# Patient Record
Sex: Female | Born: 1975 | Race: White | Hispanic: No | Marital: Married | Smoking: Never smoker
Health system: Southern US, Community
[De-identification: ages and names within clinical notes are randomized; demographics above are authoritative.]

## PROBLEM LIST (undated history)

## (undated) DIAGNOSIS — K644 Residual hemorrhoidal skin tags: Secondary | ICD-10-CM

## (undated) DIAGNOSIS — E538 Deficiency of other specified B group vitamins: Secondary | ICD-10-CM

## (undated) DIAGNOSIS — M51369 Other intervertebral disc degeneration, lumbar region without mention of lumbar back pain or lower extremity pain: Secondary | ICD-10-CM

## (undated) DIAGNOSIS — M5136 Other intervertebral disc degeneration, lumbar region: Secondary | ICD-10-CM

## (undated) HISTORY — DX: Other intervertebral disc degeneration, lumbar region: M51.36

## (undated) HISTORY — DX: Other intervertebral disc degeneration, lumbar region without mention of lumbar back pain or lower extremity pain: M51.369

## (undated) HISTORY — DX: Residual hemorrhoidal skin tags: K64.4

## (undated) HISTORY — DX: Deficiency of other specified B group vitamins: E53.8

---

## 2007-12-13 HISTORY — PX: TUBAL LIGATION: SHX77

## 2015-04-24 LAB — HM PAP SMEAR: HM PAP: NEGATIVE

## 2016-02-19 ENCOUNTER — Ambulatory Visit (INDEPENDENT_AMBULATORY_CARE_PROVIDER_SITE_OTHER): Payer: BC Managed Care – PPO | Admitting: Internal Medicine

## 2016-02-19 ENCOUNTER — Encounter: Payer: Self-pay | Admitting: Internal Medicine

## 2016-02-19 VITALS — BP 130/82 | HR 64 | Ht 62.0 in | Wt 187.0 lb

## 2016-02-19 DIAGNOSIS — K589 Irritable bowel syndrome without diarrhea: Secondary | ICD-10-CM | POA: Insufficient documentation

## 2016-02-19 DIAGNOSIS — R51 Headache: Secondary | ICD-10-CM | POA: Diagnosis not present

## 2016-02-19 DIAGNOSIS — E538 Deficiency of other specified B group vitamins: Secondary | ICD-10-CM | POA: Diagnosis not present

## 2016-02-19 DIAGNOSIS — R519 Headache, unspecified: Secondary | ICD-10-CM

## 2016-02-19 DIAGNOSIS — E559 Vitamin D deficiency, unspecified: Secondary | ICD-10-CM | POA: Diagnosis not present

## 2016-02-19 MED ORDER — CYANOCOBALAMIN 1000 MCG/ML IJ SOLN: 1000.0000 ug | INTRAMUSCULAR | Status: DC

## 2016-02-19 NOTE — Progress Notes (Signed)
Date:  02/19/2016   Name:  Melinda Nelson   DOB:  10-16-1976   MRN:  [id number] [facility]  Here to establish care.  Chief Complaint: New Evaluation; Headaches; and Vitamin B12 Deficiency B12 deficiciency - established about 3 years ago.  Initial level very low about 125.  Then got 4 weekly injections.  After that she started monthly injections.  Symptoms are fatigue and numbness. She had her follow-up B-12 level after the 4 injections which was in the 400 range. Since that time she's had no further levels drawn. There is no explanation for her deficiency; she's had no intestinal surgeries or gastric bypass.  Her main concern is that her B12 levels taper off before the end of the month and she has recurrent symptoms. She had requested more frequent dosing of her B12 but this was not permitted by her previous physician. She will her nurse friend administers the B12 at home.  Neuropathy - symptoms in left hand - numbness and tingling without pain in left arm and hand. Neurology feels that this is secondary to her B12 deficiency.  HPI Hormone related headaches - not migraines.  Evaluated by Neurology.  Worse just before menses.  She is not on any birth control.  She has tried Topamax but this worsened her neuropathy. Overall her headaches are slightly improved. She has not been back to the neurologist or tried any other empiric therapy.  MRI Brain was normal.   .Review of Systems  Constitutional: Negative for fever, chills and unexpected weight change.  Eyes: Negative for visual disturbance.  Respiratory: Negative for cough, chest tightness and shortness of breath.   Cardiovascular: Negative for chest pain, palpitations and leg swelling.  Gastrointestinal: Negative for abdominal pain.  Musculoskeletal: Negative for joint swelling and arthralgias.  Neurological: Positive for speech difficulty, numbness and headaches. Negative for dizziness, tremors, syncope and weakness.    Psychiatric/Behavioral: Negative for sleep disturbance and dysphoric mood.    There are no active problems to display for this patient.   Prior to Admission medications   Medication Sig Start Date End Date Taking? Authorizing Provider  cholecalciferol (VITAMIN D) 1000 units tablet Take 1,000 Units by mouth daily.   Yes Historical Provider, MD  cyanocobalamin (,VITAMIN B-12,) 1000 MCG/ML injection Inject 1 mL into the muscle every 30 (thirty) days. 12/01/15  Yes Historical Provider, MD  vitamin B-12 (CYANOCOBALAMIN) 1000 MCG tablet Take 1,000 mcg by mouth daily.   Yes Historical Provider, MD    Allergies  Allergen Reactions  . Lamisil [Terbinafine Hcl]     Skin reaction  . Penicillins     Rash  . Tramadol Nausea And Vomiting    Past Surgical History  Procedure Laterality Date  . Cesarean section  03,05,09    Social History  Substance Use Topics  . Smoking status: Never Smoker   . Smokeless tobacco: None  . Alcohol Use: 0.0 oz/week    0 Standard drinks or equivalent per week     Comment: rarely    Medication list has been reviewed and updated.   Physical Exam  Constitutional: She is oriented to person, place, and time. She appears well-developed. No distress.  HENT:  Head: Normocephalic and atraumatic.  Neck: Normal range of motion. Neck supple. No thyromegaly present.  Cardiovascular: Normal rate, regular rhythm and normal heart sounds.   Pulmonary/Chest: Effort normal and breath sounds normal. No respiratory distress.  Musculoskeletal: She exhibits no edema or tenderness.  Lymphadenopathy:  She has no cervical adenopathy.  Neurological: She is alert and oriented to person, place, and time. She has normal reflexes.  Skin: Skin is warm and dry. No rash noted.  Psychiatric: She has a normal mood and affect. Her behavior is normal. Thought content normal.  Nursing note and vitals reviewed.   BP 130/82 mmHg  Pulse 64  Ht  (1.575 m)  Wt 187 lb (84.823 kg)   BMI 34.19 kg/m2  LMP 01/29/2016 (Approximate)  Assessment and Plan: 1. Vitamin B12 deficiency We will check level and increase frequency to every 3 weeks After several doses will return for a trough B12 level and continue to adjust as needed - B12 - cyanocobalamin (,VITAMIN B-12,) 1000 MCG/ML injection; Inject 1 mL (1,000 mcg total) into the muscle every 14 (fourteen) days.  Dispense: 6 mL; Refill: 3  2. Vitamin D deficiency Continue daily oral supplementation Will notify patient if dose adjustment is required - VITAMIN D 25 Hydroxy (Vit-D Deficiency, Fractures)  3. Chronic daily headache stable and tolerable presently Consider referral back to neurology if worsening   Bari Edward, MD [facility]  02/19/2016

## 2016-02-20 LAB — VITAMIN D 25 HYDROXY (VIT D DEFICIENCY, FRACTURES): VIT D 25 HYDROXY: 28.2 ng/mL — AB (ref 30.0–100.0)

## 2016-02-20 LAB — VITAMIN B12: VITAMIN B 12: 388 pg/mL (ref 211–946)

## 2016-02-22 ENCOUNTER — Telehealth: Payer: Self-pay

## 2016-02-22 NOTE — Telephone Encounter (Signed)
-----   Message from Reubin MilanLaura H Berglund, MD sent at 02/20/2016  3:48 PM EST ----- B12 is low normal - take every 3 weeks and return in several months for recheck.  Vitamin D is still low - need to double supplement to 2000 IU daily.

## 2016-02-22 NOTE — Telephone Encounter (Signed)
Spoke with patient. Patient advised of all results and verbalized understanding. Will call back with any future questions or concerns. MAH  

## 2016-05-20 ENCOUNTER — Encounter: Payer: Self-pay | Admitting: Internal Medicine

## 2016-05-20 ENCOUNTER — Ambulatory Visit (INDEPENDENT_AMBULATORY_CARE_PROVIDER_SITE_OTHER): Payer: BC Managed Care – PPO | Admitting: Internal Medicine

## 2016-05-20 VITALS — BP 112/76 | HR 64 | Resp 14 | Ht 62.0 in | Wt 191.4 lb

## 2016-05-20 DIAGNOSIS — G629 Polyneuropathy, unspecified: Secondary | ICD-10-CM

## 2016-05-20 DIAGNOSIS — G4489 Other headache syndrome: Secondary | ICD-10-CM | POA: Insufficient documentation

## 2016-05-20 DIAGNOSIS — E559 Vitamin D deficiency, unspecified: Secondary | ICD-10-CM

## 2016-05-20 DIAGNOSIS — E538 Deficiency of other specified B group vitamins: Secondary | ICD-10-CM | POA: Diagnosis not present

## 2016-05-20 NOTE — Progress Notes (Signed)
Date:  05/20/2016   Name:  Coleen Cardiff   DOB:  25-Jun-1976   MRN:  [id number]   Chief Complaint: Follow-up Patient is here to follow up on B12 and Vitamin D.  She has not been taking Vitamin D regularly. She is using B12 inj every three weeks.  She feels that she may be less fatigued and her neuropathy is improving slowly.  Her only area of numbness is in her left arm and hand. Headaches are mild and unchanged.  She can usually sleep them off.  They are no longer debilitating as previously.  She has had a full neurological evaluation and MRI.   Review of Systems  Constitutional: Negative for fever, chills and unexpected weight change.  Eyes: Negative for visual disturbance.  Respiratory: Negative for cough, chest tightness and shortness of breath.   Cardiovascular: Negative for chest pain, palpitations and leg swelling.  Gastrointestinal: Negative for abdominal pain.  Musculoskeletal: Negative for joint swelling and arthralgias.  Neurological: Positive for numbness (improving - now just in left arm) and headaches (unchanged and tolerable now). Negative for dizziness, tremors, syncope, speech difficulty and weakness.  Psychiatric/Behavioral: Negative for sleep disturbance and dysphoric mood.    Patient Active Problem List   Diagnosis Date Noted  . Vitamin D deficiency 05/20/2016  . Vitamin B12 deficiency 05/20/2016  . 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 Neuropathy (HCC) 05/20/2016  . 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 Authorizing Provider  cholecalciferol (VITAMIN D) 1000 units tablet Take 1,000 Units by mouth daily.   Yes Historical Provider, MD  cyanocobalamin (,VITAMIN B-12,) 1000 MCG/ML injection Inject 1 mL (1,000 mcg total) into the muscle every 14 (fourteen) days. 02/19/16  Yes Reubin Milan, MD  vitamin B-12 (CYANOCOBALAMIN) 1000 MCG tablet Take 1,000 mcg by mouth daily. Reported on 05/20/2016     Historical Provider, MD    Allergies  Allergen Reactions  . Lamisil [Terbinafine Hcl]     Skin reaction  . Penicillins     Rash  . Tramadol Nausea And Vomiting    Past Surgical History  Procedure Laterality Date  . Cesarean section  03,05,09  . Tubal ligation  2009    Social History  Substance Use Topics  . Smoking status: Never Smoker   . Smokeless tobacco: None  . Alcohol Use: 0.0 oz/week    0 Standard drinks or equivalent per week     Comment: rarely    Medication list has been reviewed and updated.   Physical Exam  Constitutional: She is oriented to person, place, and time. She appears well-developed and well-nourished. No distress.  HENT:  Head: Normocephalic and atraumatic.  Neck: Normal range of motion. No thyromegaly present.  Cardiovascular: Normal rate, regular rhythm and normal heart sounds.   Pulmonary/Chest: Effort normal and breath sounds normal. No respiratory distress.  Neurological: She is alert and oriented to person, place, and time.  Skin: Skin is warm and dry. Rash (tick bite on left breast - benign appearance) noted.  Psychiatric: She has a normal mood and affect. Her behavior is normal. Thought content normal.  Nursing note and vitals reviewed.   BP 112/76 mmHg  Pulse 64  Resp 14  Ht  (1.575 m)  Wt 191 lb 6.4 oz (86.818 kg)  BMI 35.00 kg/m2  Assessment and Plan: 1. Vitamin D deficiency Pt encouraged to take supplement daily  2. Vitamin B12 deficiency Continue injection q  3 weeks - Vitamin B12  3. Other headache syndrome Stable; will follow up with neurology if needed  4. Neuropathy (HCC) Slowing improving   Bari EdwardLaura Daud Cayer, MD [facility]  05/20/2016

## 2016-05-21 LAB — VITAMIN B12: VITAMIN B 12: 518 pg/mL (ref 211–946)

## 2016-05-26 ENCOUNTER — Ambulatory Visit
Admission: EM | Admit: 2016-05-26 | Discharge: 2016-05-26 | Disposition: A | Discharge status: Home / Self Care | Payer: BC Managed Care – PPO | Attending: Family Medicine | Admitting: Family Medicine

## 2016-05-26 DIAGNOSIS — N390 Urinary tract infection, site not specified: Secondary | ICD-10-CM | POA: Diagnosis not present

## 2016-05-26 LAB — URINALYSIS COMPLETE WITH MICROSCOPIC (ARMC ONLY)
BILIRUBIN URINE: NEGATIVE
GLUCOSE, UA: 100 mg/dL — AB
Ketones, ur: NEGATIVE mg/dL
Nitrite: POSITIVE — AB
Protein, ur: 100 mg/dL — AB
Specific Gravity, Urine: 1.015 (ref 1.005–1.030)
pH: 7.5 (ref 5.0–8.0)

## 2016-05-26 MED ORDER — ONDANSETRON 8 MG PO TBDP
8.0000 mg | ORAL_TABLET | Freq: Once | ORAL | Status: AC
  Administered 2016-05-26: 8 mg via ORAL

## 2016-05-26 MED ORDER — PHENAZOPYRIDINE HCL 200 MG PO TABS: 200.0000 mg | ORAL_TABLET | Freq: Three times a day (TID) | ORAL | Status: DC

## 2016-05-26 MED ORDER — ONDANSETRON 8 MG PO TBDP: 8.0000 mg | ORAL_TABLET | Freq: Three times a day (TID) | ORAL | Status: DC | PRN

## 2016-05-26 MED ORDER — CIPROFLOXACIN HCL 500 MG PO TABS: 500.0000 mg | ORAL_TABLET | Freq: Two times a day (BID) | ORAL | Status: DC

## 2016-05-26 NOTE — ED Provider Notes (Signed)
CSN: [id number]     Arrival date & time 05/26/16  1742 History   First MD Initiated Contact with Patient 05/26/16 1900    Nurses notes were reviewed.  Chief Complaint  Patient presents with  . Urinary Tract Infection   Patient states was fine yesterday and then started having squishing pain this afternoon at work. She took some vitamin C tablets this a.m. which helped but at work this afternoon she started developing squishing pain discomfort nothing seemed to help. She's had UTIs before but nothing this bad and she states that the pain is not just in the urethral area but over the whole bladder area. She states that she doesn't usually get yeast infection she has a B12 deficiency and osteophytes the lumbar spine. Past surgery was C-section and tubal ligation. Mother and father both had hypertension. She's never smoked before. She is allergic to penicillin tramadol and Lamisil.  (Consider location/radiation/quality/duration/timing/severity/associated sxs/prior Treatment) Patient is a 40 y.o. female presenting with urinary tract infection. The history is provided by the patient and the spouse. No language interpreter was used.  Urinary Tract Infection Pain severity:  Moderate Onset quality:  Sudden Duration:  2 days Progression:  Worsening Chronicity:  New Recent urinary tract infections: yes   Relieved by:  Nothing Urinary symptoms: discolored urine and frequent urination   Associated symptoms: no fever   Risk factors: hx of pyelonephritis, kidney transplant, recurrent urinary tract infections and sexually transmitted infections     Past Medical History  Diagnosis Date  . B12 deficiency   . Degenerative disc disease, lumbar     L4-L5   Past Surgical History  Procedure Laterality Date  . Cesarean section  03,05,09  . Tubal ligation  2009   Family History  Problem Relation Age of Onset  . Hypertension Mother   . Hypertension Father   . Barrett's esophagus Father    Social  History  Substance Use Topics  . Smoking status: Never Smoker   . Smokeless tobacco: None  . Alcohol Use: 0.0 oz/week    0 Standard drinks or equivalent per week     Comment: rarely   OB History    No data available     Review of Systems  Constitutional: Negative for fever.  All other systems reviewed and are negative.   Allergies  Lamisil; Penicillins; and Tramadol  Home Medications   Prior to Admission medications   Medication Sig Start Date End Date Taking? Authorizing Provider  cholecalciferol (VITAMIN D) 1000 units tablet Take 1,000 Units by mouth daily.   Yes Historical Provider, MD  cyanocobalamin (,VITAMIN B-12,) 1000 MCG/ML injection Inject 1 mL (1,000 mcg total) into the muscle every 14 (fourteen) days. 02/19/16  Yes Reubin MilanLaura H Berglund, MD  vitamin B-12 (CYANOCOBALAMIN) 1000 MCG tablet Take 1,000 mcg by mouth daily. Reported on 05/20/2016   Yes Historical Provider, MD  ciprofloxacin (CIPRO) 500 MG tablet Take 1 tablet (500 mg total) by mouth 2 (two) times daily. 05/26/16   Hassan RowanEugene Ruthanna Macchia, MD  ondansetron (ZOFRAN ODT) 8 MG disintegrating tablet Take 1 tablet (8 mg total) by mouth every 8 (eight) hours as needed for nausea or vomiting. 05/26/16   Hassan RowanEugene Michaiah Maiden, MD  phenazopyridine (PYRIDIUM) 200 MG tablet Take 1 tablet (200 mg total) by mouth 3 (three) times daily. 05/26/16   Hassan RowanEugene Palak Tercero, MD   Meds Ordered and Administered this Visit   Medications  ondansetron (ZOFRAN-ODT) disintegrating tablet 8 mg (8 mg Oral Given 05/26/16 1908)    BP  135/87 mmHg  Pulse 84  Temp(Src) 98.6 F (37 C) (Oral)  Resp 16  Ht  (1.575 m)  Wt 190 lb (86.183 kg)  BMI 34.74 kg/m2  SpO2 98%  LMP 05/15/2016 No data found.   Physical Exam  Constitutional: She is oriented to person, place, and time. She appears well-developed and well-nourished.  HENT:  Head: Normocephalic and atraumatic.  Eyes: Pupils are equal, round, and reactive to light.  Neck: Neck supple.  Abdominal: Soft. Bowel sounds  are normal. She exhibits no distension. There is no hepatosplenomegaly. There is no tenderness. There is no CVA tenderness. No hernia. Hernia confirmed negative in the ventral area.  Musculoskeletal: Normal range of motion.  Neurological: She is alert and oriented to person, place, and time.  Skin: Skin is warm and dry.  Psychiatric: She has a normal mood and affect.  Vitals reviewed.   ED Course  Procedures (including critical care time)  Labs Review Labs Reviewed  URINALYSIS COMPLETEWITH MICROSCOPIC ([facility] ONLY) - Abnormal; Notable for the following:    Color, Urine ORANGE (*)    APPearance CLOUDY (*)    Glucose, UA 100 (*)    Hgb urine dipstick 3+ (*)    Protein, ur 100 (*)    Nitrite POSITIVE (*)    Leukocytes, UA 1+ (*)    Bacteria, UA FEW (*)    Squamous Epithelial / LPF 0-5 (*)    All other components within normal limits  URINE CULTURE    Imaging Review No results found.   Visual Acuity Review  Right Eye Distance:   Left Eye Distance:   Bilateral Distance:    Right Eye Near:   Left Eye Near:    Bilateral Near:     Results for orders placed or performed during the hospital encounter of 05/26/16  Urinalysis complete, with microscopic  Result Value Ref Range   Color, Urine ORANGE (A) YELLOW   APPearance CLOUDY (A) CLEAR   Glucose, UA 100 (A) NEGATIVE mg/dL   Bilirubin Urine NEGATIVE NEGATIVE   Ketones, ur NEGATIVE NEGATIVE mg/dL   Specific Gravity, Urine 1.015 1.005 - 1.030   Hgb urine dipstick 3+ (A) NEGATIVE   pH 7.5 5.0 - 8.0   Protein, ur 100 (A) NEGATIVE mg/dL   Nitrite POSITIVE (A) NEGATIVE   Leukocytes, UA 1+ (A) NEGATIVE   RBC / HPF 6-30 0 - 5 RBC/hpf   WBC, UA TOO NUMEROUS TO COUNT 0 - 5 WBC/hpf   Bacteria, UA FEW (A) NONE SEEN   Squamous Epithelial / LPF 0-5 (A) NONE SEEN   WBC Clumps PRESENT      MDM   1. UTI (lower urinary tract infection)     We'll place patient on Cipro 500 mg 1 tablet twice a day for a week. Pyridium 20 mg 3  times a day. Since she's had nausea with this Zofran 8 mg sublingual here and then again will give a prescription. Work note for today and tomorrow also be given to patient. Follow-up PCP in 2 weeks and urine culture was ordered.   Note: This dictation was prepared with Dragon dictation along with smaller phrase technology. Any transcriptional errors that result from this process are unintentional.     Hassan Rowan, MD 05/26/16 [phone]

## 2016-05-26 NOTE — ED Notes (Signed)
Patient complains of sharp stabbing pain and fullness of bladder. Patient states that she has been having cramping of her bladder. She states that she was having visible blood in her urine. Patient states that she took AZO around 2:15pm this afternoon.

## 2016-05-26 NOTE — Discharge Instructions (Signed)
Dysuria °Dysuria is pain or discomfort while urinating. The pain or discomfort may be felt in the tube that carries urine out of the bladder (urethra) or in the surrounding tissue of the genitals. The pain may also be felt in the groin area, lower abdomen, and lower back. You may have to urinate frequently or have the sudden feeling that you have to urinate (urgency). Dysuria can affect both men and women, but is more common in women. °Dysuria can be caused by many different things, including: °· Urinary tract infection in women. °· Infection of the kidney or bladder. °· Kidney stones or bladder stones. °· Certain sexually transmitted infections (STIs), such as chlamydia. °· Dehydration. °· Inflammation of the vagina. °· Use of certain medicines. °· Use of certain soaps or scented products that cause irritation. °HOME CARE INSTRUCTIONS °Watch your dysuria for any changes. The following actions may help to reduce any discomfort you are feeling: °· Drink enough fluid to keep your urine clear or pale yellow. °· Empty your bladder often. Avoid holding urine for long periods of time. °· After a bowel movement or urination, women should cleanse from front to back, using each tissue only once. °· Empty your bladder after sexual intercourse. °· Take medicines only as directed by your health care provider. °· If you were prescribed an antibiotic medicine, finish it all even if you start to feel better. °· Avoid caffeine, tea, and alcohol. They can irritate the bladder and make dysuria worse. In men, alcohol may irritate the prostate. °· Keep all follow-up visits as directed by your health care provider. This is important. °· If you had any tests done to find the cause of dysuria, it is your responsibility to obtain your test results. Ask the lab or department performing the test when and how you will get your results. Talk with your health care provider if you have any questions about your results. °SEEK MEDICAL CARE  IF: °· You develop pain in your back or sides. °· You have a fever. °· You have nausea or vomiting. °· You have blood in your urine. °· You are not urinating as often as you usually do. °SEEK IMMEDIATE MEDICAL CARE IF: °· You pain is severe and not relieved with medicines. °· You are unable to hold down any fluids. °· You or someone else notices a change in your mental function. °· You have a rapid heartbeat at rest. °· You have shaking or chills. °· You feel extremely weak. °  °This information is not intended to replace advice given to you by your health care provider. Make sure you discuss any questions you have with your health care provider. °  °Document Released: 08/26/2004 Document Revised: 12/19/2014 Document Reviewed: 07/24/2014 °Elsevier Interactive Patient Education ©2016 [employer] ° °Urinary Tract Infection °A urinary tract infection (UTI) can occur any place along the urinary tract. The tract includes the kidneys, ureters, bladder, and urethra. A type of germ called bacteria often causes a UTI. UTIs are often helped with antibiotic medicine.  °HOME CARE  °· If given, take antibiotics as told by your doctor. Finish them even if you start to feel better. °· Drink enough fluids to keep your pee (urine) clear or pale yellow. °· Avoid tea, drinks with caffeine, and bubbly (carbonated) drinks. °· Pee often. Avoid holding your pee in for a long time. °· Pee before and after having sex (intercourse). °· Wipe from front to back after you poop (bowel movement) if you are a   woman. Use each tissue only once. °GET HELP RIGHT AWAY IF:  °· You have back pain. °· You have lower belly (abdominal) pain. °· You have chills. °· You feel sick to your stomach (nauseous). °· You throw up (vomit). °· Your burning or discomfort with peeing does not go away. °· You have a fever. °· Your symptoms are not better in 3 days. °MAKE SURE YOU:  °· Understand these instructions. °· Will watch your condition. °· Will get help right  away if you are not doing well or get worse. °  °This information is not intended to replace advice given to you by your health care provider. Make sure you discuss any questions you have with your health care provider. °  °Document Released: 05/16/2008 Document Revised: 12/19/2014 Document Reviewed: 06/28/2012 °Elsevier Interactive Patient Education ©2016 [employer] ° °

## 2016-05-27 ENCOUNTER — Ambulatory Visit: Payer: BC Managed Care – PPO | Admitting: Internal Medicine

## 2016-05-28 LAB — URINE CULTURE: SPECIAL REQUESTS: NORMAL

## 2016-07-18 ENCOUNTER — Ambulatory Visit (INDEPENDENT_AMBULATORY_CARE_PROVIDER_SITE_OTHER): Payer: BC Managed Care – PPO | Admitting: Internal Medicine

## 2016-07-18 ENCOUNTER — Encounter: Payer: Self-pay | Admitting: Internal Medicine

## 2016-07-18 VITALS — BP 122/80 | HR 64 | Temp 100.1°F | Resp 16 | Ht 62.0 in | Wt 190.0 lb

## 2016-07-18 DIAGNOSIS — R509 Fever, unspecified: Secondary | ICD-10-CM | POA: Diagnosis not present

## 2016-07-18 MED ORDER — DOXYCYCLINE HYCLATE 100 MG PO TABS: 100.0000 mg | ORAL_TABLET | Freq: Two times a day (BID) | ORAL | 0 refills | Status: DC

## 2016-07-18 NOTE — Progress Notes (Signed)
Date:  07/18/2016   Name:  Melinda Nelson   DOB:  1976/10/25   MRN:  [id number]   Chief Complaint: Headache (1 week); Back Pain (1 week); and Fever (low grade) Headache   This is a new problem. The current episode started in the past 7 days. The problem occurs constantly. The problem has been gradually worsening. The pain is located in the occipital region. The pain does not radiate. The quality of the pain is described as boring. Associated symptoms include back pain, a fever and nausea. Pertinent negatives include no coughing, ear pain, sinus pressure or sore throat.  Back Pain  This is a new problem. The pain is present in the lumbar spine. The quality of the pain is described as shooting. Associated symptoms include a fever and headaches. Pertinent negatives include no chest pain.  Fever   The current episode started in the past 7 days. The maximum temperature noted was 101 to 101.9 F. Associated symptoms include headaches and nausea. Pertinent negatives include no chest pain, coughing, ear pain, rash or sore throat. She has tried NSAIDs for the symptoms. The treatment provided moderate relief.   She has no neck stiffness and fever responds to Advil.  She has had multiple tick bites this summer.  She denies rash.     Review of Systems  Constitutional: Positive for fever.  HENT: Negative for ear pain, sinus pressure and sore throat.   Respiratory: Negative for cough.   Cardiovascular: Negative for chest pain, palpitations and leg swelling.  Gastrointestinal: Positive for nausea.  Musculoskeletal: Positive for back pain.  Skin: Negative for rash.  Neurological: Positive for headaches.    Patient Active Problem List   Diagnosis Date Noted  . Vitamin D deficiency 05/20/2016  . Vitamin B12 deficiency 05/20/2016  . 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 Neuropathy (HCC) 05/20/2016  . 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 Authorizing Provider  cholecalciferol (VITAMIN D) 1000 units tablet Take 1,000 Units by mouth daily.   Yes Historical Provider, MD  cyanocobalamin (,VITAMIN B-12,) 1000 MCG/ML injection Inject 1 mL (1,000 mcg total) into the muscle every 14 (fourteen) days. 02/19/16  Yes Reubin MilanLaura H Caswell Alvillar, MD  phenazopyridine (PYRIDIUM) 200 MG tablet Take 1 tablet (200 mg total) by mouth 3 (three) times daily. 05/26/16  Yes Hassan RowanEugene Wade, MD  vitamin B-12 (CYANOCOBALAMIN) 1000 MCG tablet Take 1,000 mcg by mouth daily. Reported on 05/20/2016   Yes Historical Provider, MD    Allergies  Allergen Reactions  . Lamisil [Terbinafine Hcl]     Skin reaction  . Penicillins     Rash  . Tramadol Nausea And Vomiting    Past Surgical History:  Procedure Laterality Date  . CESAREAN SECTION  03,05,09  . TUBAL LIGATION  2009    Social History  Substance Use Topics  . Smoking status: Never Smoker  . Smokeless tobacco: Never Used  . Alcohol use 0.0 oz/week     Comment: rarely     Medication list has been reviewed and updated.   Physical Exam  Constitutional: She is oriented to person, place, and time. She appears well-developed. She has a sickly appearance. No distress.  HENT:  Head: Normocephalic and atraumatic.  Right Ear: Tympanic membrane and ear canal normal.  Left Ear: Tympanic membrane and ear canal normal.  Nose: Right sinus exhibits no maxillary sinus tenderness. Left sinus exhibits no maxillary sinus tenderness.  Mouth/Throat:  No posterior oropharyngeal edema or posterior oropharyngeal erythema.  Neck: Normal range of motion and full passive range of motion without pain. Neck supple. Carotid bruit is not present.  Cardiovascular: Normal rate, regular rhythm and normal heart sounds.   Pulmonary/Chest: Effort normal and breath sounds normal. No respiratory distress.  Musculoskeletal: Normal range of motion.  Neurological: She is alert and oriented to person, place, and time.  Skin: Skin is warm  and dry. Rash noted. Rash is macular (fine macular rash on forearms).  Psychiatric: She has a normal mood and affect. Her behavior is normal. Thought content normal.    BP 122/80 (BP Location: Right Arm, Patient Position: Sitting, Cuff Size: Normal)   Pulse 64   Temp 100.1 F (37.8 C) (Oral)   Resp 16   Ht  (1.575 m)   Wt 190 lb (86.2 kg)   SpO2 96%   BMI 34.75 kg/m   Assessment and Plan: 1. Other specified fever  treat for tick related illness Call if worsening - doxycycline (VIBRA-TABS) 100 MG tablet; Take 1 tablet (100 mg total) by mouth 2 (two) times daily.  Dispense: 20 tablet; Refill: 0   Bari Edward, MD [facility]  07/18/2016

## 2016-10-26 ENCOUNTER — Ambulatory Visit (INDEPENDENT_AMBULATORY_CARE_PROVIDER_SITE_OTHER): Payer: BC Managed Care – PPO | Admitting: Internal Medicine

## 2016-10-26 ENCOUNTER — Encounter: Payer: Self-pay | Admitting: Internal Medicine

## 2016-10-26 VITALS — BP 138/80 | HR 67 | Resp 16 | Ht 62.0 in | Wt 187.0 lb

## 2016-10-26 DIAGNOSIS — K644 Residual hemorrhoidal skin tags: Secondary | ICD-10-CM

## 2016-10-26 DIAGNOSIS — Z23 Encounter for immunization: Secondary | ICD-10-CM | POA: Diagnosis not present

## 2016-10-26 HISTORY — DX: Residual hemorrhoidal skin tags: K64.4

## 2016-10-26 MED ORDER — HYDROCORTISONE ACETATE 25 MG RE SUPP: 25.0000 mg | Freq: Two times a day (BID) | RECTAL | 5 refills | Status: DC

## 2016-10-26 NOTE — Progress Notes (Signed)
    Date:  10/26/2016   Name:  Melinda Nelson   DOB:  [birth year]   MRN:  [id number]   Chief Complaint: Hemorrhoids HPI Patient has hx of hemorrhoids that she treats as needed with anusol suppositories.  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  It is tender and swollen but not bleeding.  She ran out of suppositories yesterday.   Review of Systems  Constitutional: Negative for chills and fatigue.  Respiratory: Negative for chest tightness and shortness of breath.   Cardiovascular: Negative for chest pain.  Gastrointestinal: Positive for rectal pain. Negative for anal bleeding, constipation and nausea.  Genitourinary: Negative for dysuria.    Patient Active Problem List   Diagnosis Date Noted  . Vitamin D deficiency 05/20/2016  . Vitamin B12 deficiency 05/20/2016  . 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 Neuropathy (HCC) 05/20/2016  . 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 Authorizing Provider  cholecalciferol (VITAMIN D) 1000 units tablet Take 1,000 Units by mouth daily.   Yes Historical Provider, MD  cyanocobalamin (,VITAMIN B-12,) 1000 MCG/ML injection Inject 1 mL (1,000 mcg total) into the muscle every 14 (fourteen) days. 02/19/16  Yes Reubin MilanLaura H Berglund, MD    Allergies  Allergen Reactions  . Lamisil [Terbinafine Hcl]     Skin reaction  . Penicillins     Rash  . Tramadol Nausea And Vomiting    Past Surgical History:  Procedure Laterality Date  . CESAREAN SECTION  03,05,09  . TUBAL LIGATION  2009    Social History  Substance Use Topics  . Smoking status: Never Smoker  . Smokeless tobacco: Never Used  . Alcohol use 0.0 oz/week     Comment: rarely     Medication list has been reviewed and updated.   Physical Exam  Constitutional: She is oriented to person, place, and time. She appears well-developed. No distress.  HENT:  Head: Normocephalic and  atraumatic.  Pulmonary/Chest: Effort normal. No respiratory distress.  Abdominal: Soft. Normal appearance.  Genitourinary: Rectal exam shows external hemorrhoid (tender, not thrombosed or bleeding).  Musculoskeletal: Normal range of motion.  Neurological: She is alert and oriented to person, place, and time.  Skin: Skin is warm and dry. No rash noted.  Psychiatric: She has a normal mood and affect. Her behavior is normal. Thought content normal.  Nursing note and vitals reviewed.   BP 138/80   Pulse 67   Resp 16   Ht 5\' 2"  (1.575 m)   Wt 187 lb (84.8 kg)   LMP 10/19/2016 (Approximate)   SpO2 98%   BMI 34.20 kg/m   Assessment and Plan: 1. Inflamed external hemorrhoid - hydrocortisone (ANUSOL-HC) 25 MG suppository; Place 1 suppository (25 mg total) rectally 2 (two) times daily.  Dispense: 12 suppository; Refill: 5  2. Encounter for immunization - Flu Vaccine QUAD 36+ mos IM   Bari EdwardLaura Berglund, MD [facility]  10/26/2016

## 2016-12-09 LAB — HM MAMMOGRAPHY

## 2016-12-13 ENCOUNTER — Ambulatory Visit (INDEPENDENT_AMBULATORY_CARE_PROVIDER_SITE_OTHER): Payer: BC Managed Care – PPO | Admitting: Internal Medicine

## 2016-12-13 ENCOUNTER — Encounter: Payer: Self-pay | Admitting: Internal Medicine

## 2016-12-13 ENCOUNTER — Ambulatory Visit
Admission: RE | Admit: 2016-12-13 | Discharge: 2016-12-13 | Disposition: A | Discharge status: Home / Self Care | Payer: BC Managed Care – PPO | Source: Ambulatory Visit | Attending: Internal Medicine | Admitting: Internal Medicine

## 2016-12-13 VITALS — BP 142/82 | HR 76 | Temp 97.6°F | Ht 62.0 in | Wt 192.0 lb

## 2016-12-13 DIAGNOSIS — W19XXXA Unspecified fall, initial encounter: Secondary | ICD-10-CM | POA: Diagnosis not present

## 2016-12-13 DIAGNOSIS — E538 Deficiency of other specified B group vitamins: Secondary | ICD-10-CM

## 2016-12-13 DIAGNOSIS — S4991XA Unspecified injury of right shoulder and upper arm, initial encounter: Secondary | ICD-10-CM

## 2016-12-13 DIAGNOSIS — M25531 Pain in right wrist: Secondary | ICD-10-CM | POA: Insufficient documentation

## 2016-12-13 NOTE — Progress Notes (Signed)
Date:  12/13/2016   Name:  Colon FlatteryJill Shomaker   DOB:  1976-03-31   MRN:  [id number]   Chief Complaint: Arm Pain Arm Pain   The incident occurred more than 1 week ago. The incident occurred at home. The injury mechanism was a direct blow (thrown down from standing by a horse). The pain is present in the right forearm. The quality of the pain is described as aching. The pain radiates to the right arm. The pain is mild. The pain has been constant since the incident. Pertinent negatives include no chest pain, muscle weakness, numbness or tingling.   B12 - getting injections every 2 weeks.  Feels like the sx improve for about 4 days then worsen again.  She has neuropathic sx in both hands and also has numbness in both UE from shoulder to elbow when she lies on the corresponding side.   Review of Systems  Constitutional: Negative for chills, fatigue and fever.  Respiratory: Negative for chest tightness and shortness of breath.   Cardiovascular: Negative for chest pain.  Musculoskeletal: Positive for arthralgias.  Neurological: Negative for tingling and numbness.    Patient Active Problem List   Diagnosis Date Noted  . Inflamed external hemorrhoid 10/26/2016  . Vitamin D deficiency 05/20/2016  . Vitamin B12 deficiency 05/20/2016  . 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 Neuropathy (HCC) 05/20/2016  . 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 Authorizing Provider  cholecalciferol (VITAMIN D) 1000 units tablet Take 1,000 Units by mouth daily.   Yes Historical Provider, MD  cyanocobalamin (,VITAMIN B-12,) 1000 MCG/ML injection Inject 1 mL (1,000 mcg total) into the muscle every 14 (fourteen) days. 02/19/16  Yes Reubin MilanLaura H Sharise Lippy, MD  hydrocortisone (ANUSOL-HC) 25 MG suppository Place 1 suppository (25 mg total) rectally 2 (two) times daily. 10/26/16  Yes Reubin MilanLaura H Chamya Hunton, MD    Allergies  Allergen Reactions  . Lamisil [Terbinafine  Hcl]     Skin reaction  . Penicillins     Rash  . Tramadol Nausea And Vomiting    Past Surgical History:  Procedure Laterality Date  . CESAREAN SECTION  03,05,09  . TUBAL LIGATION  2009    Social History  Substance Use Topics  . Smoking status: Never Smoker  . Smokeless tobacco: Never Used  . Alcohol use 0.0 oz/week     Comment: rarely     Medication list has been reviewed and updated.   Physical Exam  Constitutional: She is oriented to person, place, and time. She appears well-developed. No distress.  HENT:  Head: Normocephalic and atraumatic.  Pulmonary/Chest: Effort normal. No respiratory distress.  Musculoskeletal: Normal range of motion.  Tender over distal radius on right; no deformity noted Mild soft tissue tenderness along outer forearm  Neurological: She is alert and oriented to person, place, and time.  Skin: Skin is warm and dry. No rash noted.  Psychiatric: She has a normal mood and affect. Her behavior is normal. Thought content normal.  Nursing note and vitals reviewed.   BP (!) 142/82   Pulse 76   Temp 97.6 F (36.4 C)   Ht 5\' 2"  (1.575 m)   Wt 192 lb (87.1 kg)   SpO2 98%   BMI 35.12 kg/m   Assessment and Plan: 1. Injury of right upper extremity, initial encounter Suspect occult fracture  Continue Advil every 6 hours; modify activities - DG Wrist Complete Right; Future  2. Vitamin  B12 deficiency Try 500 mg IM every week   Bari Edward, MD [facility]  12/13/2016

## 2017-04-01 ENCOUNTER — Other Ambulatory Visit: Payer: Self-pay | Admitting: Internal Medicine

## 2017-04-01 DIAGNOSIS — E538 Deficiency of other specified B group vitamins: Secondary | ICD-10-CM

## 2017-05-09 ENCOUNTER — Telehealth: Payer: Self-pay

## 2017-05-09 NOTE — Telephone Encounter (Signed)
Pt called stating back is spasming and having trouble moving. She states "steriods only thing that helped for this previously." I called and left pt VM explaining we have not seen her previously for this, and either way would still need to see her for OV before anything is prescribed. Told to call back and schedule office visit.

## 2017-05-19 ENCOUNTER — Ambulatory Visit: Payer: BC Managed Care – PPO | Admitting: Internal Medicine

## 2017-07-26 ENCOUNTER — Telehealth: Payer: Self-pay

## 2017-07-26 NOTE — Telephone Encounter (Signed)
Patient called stating [facility]. Then they recommended a follow up Ultrasound and Mammo for the LEFT breast. She had that done in January. They contacted her to get order from PCP for Diagnostic 6 month follow up of ultrasound and mammo of LEFT breast. Pt needs order. Please Advise.

## 2017-07-26 NOTE — Telephone Encounter (Signed)
Patient informed needs to [facility]. Dr Judithann GravesBerglund does not have report from follow up ultrasound or mammo. Told to call back if questions.

## 2017-07-26 NOTE — Telephone Encounter (Signed)
I can only see the [facility].  Additional views were recommended. I do not have that report of the study done at [facility].  If [facility], then [facility] needs to be the ones to make the referral again.

## 2017-11-26 ENCOUNTER — Ambulatory Visit (INDEPENDENT_AMBULATORY_CARE_PROVIDER_SITE_OTHER): Payer: BC Managed Care – PPO

## 2017-11-26 ENCOUNTER — Ambulatory Visit
Admission: EM | Admit: 2017-11-26 | Discharge: 2017-11-26 | Disposition: A | Discharge status: Home / Self Care | Payer: BC Managed Care – PPO | Attending: Family Medicine | Admitting: Family Medicine

## 2017-11-26 ENCOUNTER — Other Ambulatory Visit: Payer: Self-pay

## 2017-11-26 DIAGNOSIS — R05 Cough: Secondary | ICD-10-CM

## 2017-11-26 DIAGNOSIS — J9801 Acute bronchospasm: Secondary | ICD-10-CM | POA: Diagnosis not present

## 2017-11-26 DIAGNOSIS — R0602 Shortness of breath: Secondary | ICD-10-CM | POA: Diagnosis not present

## 2017-11-26 DIAGNOSIS — J101 Influenza due to other identified influenza virus with other respiratory manifestations: Secondary | ICD-10-CM

## 2017-11-26 MED ORDER — HYDROCOD POLST-CPM POLST ER 10-8 MG/5ML PO SUER: 5.0000 mL | Freq: Two times a day (BID) | ORAL | 0 refills | Status: DC | PRN

## 2017-11-26 MED ORDER — ALBUTEROL SULFATE HFA 108 (90 BASE) MCG/ACT IN AERS: 1.0000 | INHALATION_SPRAY | Freq: Four times a day (QID) | RESPIRATORY_TRACT | 0 refills | Status: DC | PRN

## 2017-11-26 MED ORDER — PREDNISONE 20 MG PO TABS: 20.0000 mg | ORAL_TABLET | Freq: Every day | ORAL | 0 refills | Status: DC

## 2017-11-26 MED ORDER — IPRATROPIUM-ALBUTEROL 0.5-2.5 (3) MG/3ML IN SOLN
3.0000 mL | Freq: Four times a day (QID) | RESPIRATORY_TRACT | Status: DC
  Administered 2017-11-26: 3 mL via RESPIRATORY_TRACT

## 2017-11-26 NOTE — ED Provider Notes (Signed)
[facility]    CSN: [id number] Arrival date & time: 11/26/17  1520     History   Chief Complaint Chief Complaint  Patient presents with  . Shortness of Breath    HPI Melinda Nelson is a 41 y.o. female.   41 yo female with a recent diagnosis of influenza B presents with a c/o worsening shortness of breath and wheezing over the last 2 days.    The history is provided by the patient.  Shortness of Breath    Past Medical History:  Diagnosis Date  . B12 deficiency   . Degenerative disc disease, lumbar    L4-L5    Patient Active Problem List   Diagnosis Date Noted  . Inflamed external hemorrhoid 10/26/2016  . Vitamin D deficiency 05/20/2016  . Vitamin B12 deficiency 05/20/2016  . 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 Neuropathy 05/20/2016  . 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 CESAREAN SECTION  03,05,09  . TUBAL LIGATION  2009    OB History    No data available       Home Medications    Prior to Admission medications   Medication Sig Start Date End Date Taking? Authorizing Provider  pseudoephedrine-guaifenesin (MUCINEX D) 60-600 MG 12 hr tablet Take 1 tablet by mouth every 12 (twelve) hours.   Yes [provider]  albuterol (PROVENTIL HFA;VENTOLIN HFA) 108 (90 Base) MCG/ACT inhaler Inhale 1-2 puffs into the lungs every 6 (six) hours as needed for wheezing or shortness of breath. 11/26/17   Payton Mccallumonty, Dennis Hegeman, MD  chlorpheniramine-HYDROcodone (TUSSIONEX PENNKINETIC ER) 10-8 MG/5ML SUER Take 5 mLs by mouth every 12 (twelve) hours as needed. 11/26/17   Payton Mccallumonty, Lisbet Busker, MD  cholecalciferol (VITAMIN D) 1000 units tablet Take 1,000 Units by mouth daily.    [provider]  cyanocobalamin (,VITAMIN B-12,) 1000 MCG/ML injection INJECT 1 ML (1,000 MCG TOTAL) INTO THE MUSCLE EVERY 14 (FOURTEEN) DAYS. 04/01/17   Reubin MilanBerglund, Laura H, MD  hydrocortisone (ANUSOL-HC) 25 MG suppository Place 1 suppository (25  mg total) rectally 2 (two) times daily. 10/26/16   Reubin MilanBerglund, Laura H, MD  predniSONE (DELTASONE) 20 MG tablet Take 1 tablet (20 mg total) by mouth daily. 11/26/17   Payton Mccallumonty, Pamela Intrieri, MD    Family History Family History  Problem Relation Age of Onset  . Hypertension Mother   . Hypertension Father   . Barrett's esophagus Father     Social History Social History   Tobacco Use  . Smoking status: Never Smoker  . Smokeless tobacco: Never Used  Substance Use Topics  . Alcohol use: Yes    Alcohol/week: 0.0 oz    Comment: rarely  . Drug use: No     Allergies   Lamisil [terbinafine]; Penicillins; and Tramadol   Review of Systems Review of Systems  Respiratory: Positive for shortness of breath.      Physical Exam Triage Vital Signs ED Triage Vitals  Enc Vitals Group     BP 11/26/17 1529 (!) 148/96     Pulse Rate 11/26/17 1529 99     Resp 11/26/17 1529 (!) 30     Temp 11/26/17 1529 99.6 F (37.6 C)     Temp Source 11/26/17 1529 Oral     SpO2 11/26/17 1529 98 %     Weight 11/26/17 1530 185 lb (83.9 kg)     Height 11/26/17 1530 5\' 2"  (1.575 m)     Head Circumference --  Peak Flow --      Pain Score 11/26/17 1531 3     Pain Loc --      Pain Edu? --      Excl. in GC? --    No data found.  Updated Vital Signs BP 127/83 (BP Location: Right Arm)   Pulse 83   Temp 98.8 F (37.1 C)   Resp (!) 26   Ht 5\' 2"  (1.575 m)   Wt 185 lb (83.9 kg)   LMP 11/19/2017   SpO2 98%   BMI 33.84 kg/m   Visual Acuity Right Eye Distance:   Left Eye Distance:   Bilateral Distance:    Right Eye Near:   Left Eye Near:    Bilateral Near:     Physical Exam  Constitutional: She appears well-developed and well-nourished. No distress.  Cardiovascular: Normal rate, regular rhythm and normal heart sounds.  No murmur heard. Pulmonary/Chest: Effort normal. No respiratory distress. She has wheezes.  Diffuse rhonchi and wheezes  Skin: She is not diaphoretic.  Nursing note and vitals  reviewed.    UC Treatments / Results  Labs (all labs ordered are listed, but only abnormal results are displayed) Labs Reviewed - No data to display  EKG  EKG Interpretation None       Radiology Dg Chest 2 View  Result Date: 11/26/2017 CLINICAL DATA:  Cough. EXAM: CHEST  2 VIEW COMPARISON:  None. FINDINGS: The heart size and mediastinal contours are within normal limits. Both lungs are clear. The visualized skeletal structures are unremarkable. IMPRESSION: No active cardiopulmonary disease. Electronically Signed   By: Gerome Samavid  Williams III M.D   On: 11/26/2017 16:02    Procedures Procedures (including critical care time)  Medications Ordered in UC Medications  ipratropium-albuterol (DUONEB) 0.5-2.5 (3) MG/3ML nebulizer solution 3 mL (3 mLs Nebulization Given 11/26/17 1539)     Initial Impression / Assessment and Plan / UC Course  I have reviewed the triage vital signs and the nursing notes.  Pertinent labs & imaging results that were available during my care of the patient were reviewed by me and considered in my medical decision making (see chart for details).       Final Clinical Impressions(s) / UC Diagnoses   Final diagnoses:  Bronchospasm, acute  Shortness of breath  Influenza B    ED Discharge Orders        Ordered    albuterol (PROVENTIL HFA;VENTOLIN HFA) 108 (90 Base) MCG/ACT inhaler  Every 6 hours PRN     11/26/17 1614    predniSONE (DELTASONE) 20 MG tablet  Daily     11/26/17 1614    chlorpheniramine-HYDROcodone (TUSSIONEX PENNKINETIC ER) 10-8 MG/5ML SUER  Every 12 hours PRN     11/26/17 1614     1. x-ray results and diagnosis reviewed with patient 2. Given duoneb x 1 with improvement of symptoms 3.  rx as per orders above; reviewed possible side effects, interactions, risks and benefits  4. Recommend supportive treatment with increased fluids  5. Follow-up prn if symptoms worsen or don't improve   Controlled Substance Prescriptions [place]  Controlled Substance Registry consulted? Not Applicable   Payton Mccallumonty, Arelis Neumeier, MD 11/26/17 [phone]

## 2017-11-26 NOTE — ED Triage Notes (Signed)
Pt was diagnosed with Influenza B on Thursday. Has had cough but her SOB is much worse today. Has been having "coughing fits" and wheezing. Was prescribed an inhaler but didn't get it filled. Pain 3/10

## 2018-02-17 IMAGING — CR DG CHEST 2V
2 series · 2 of 2 positions shown · non-contrast
Comparison: None.

CLINICAL DATA: Cough.

EXAM:
CHEST  2 VIEW

[chest pa]
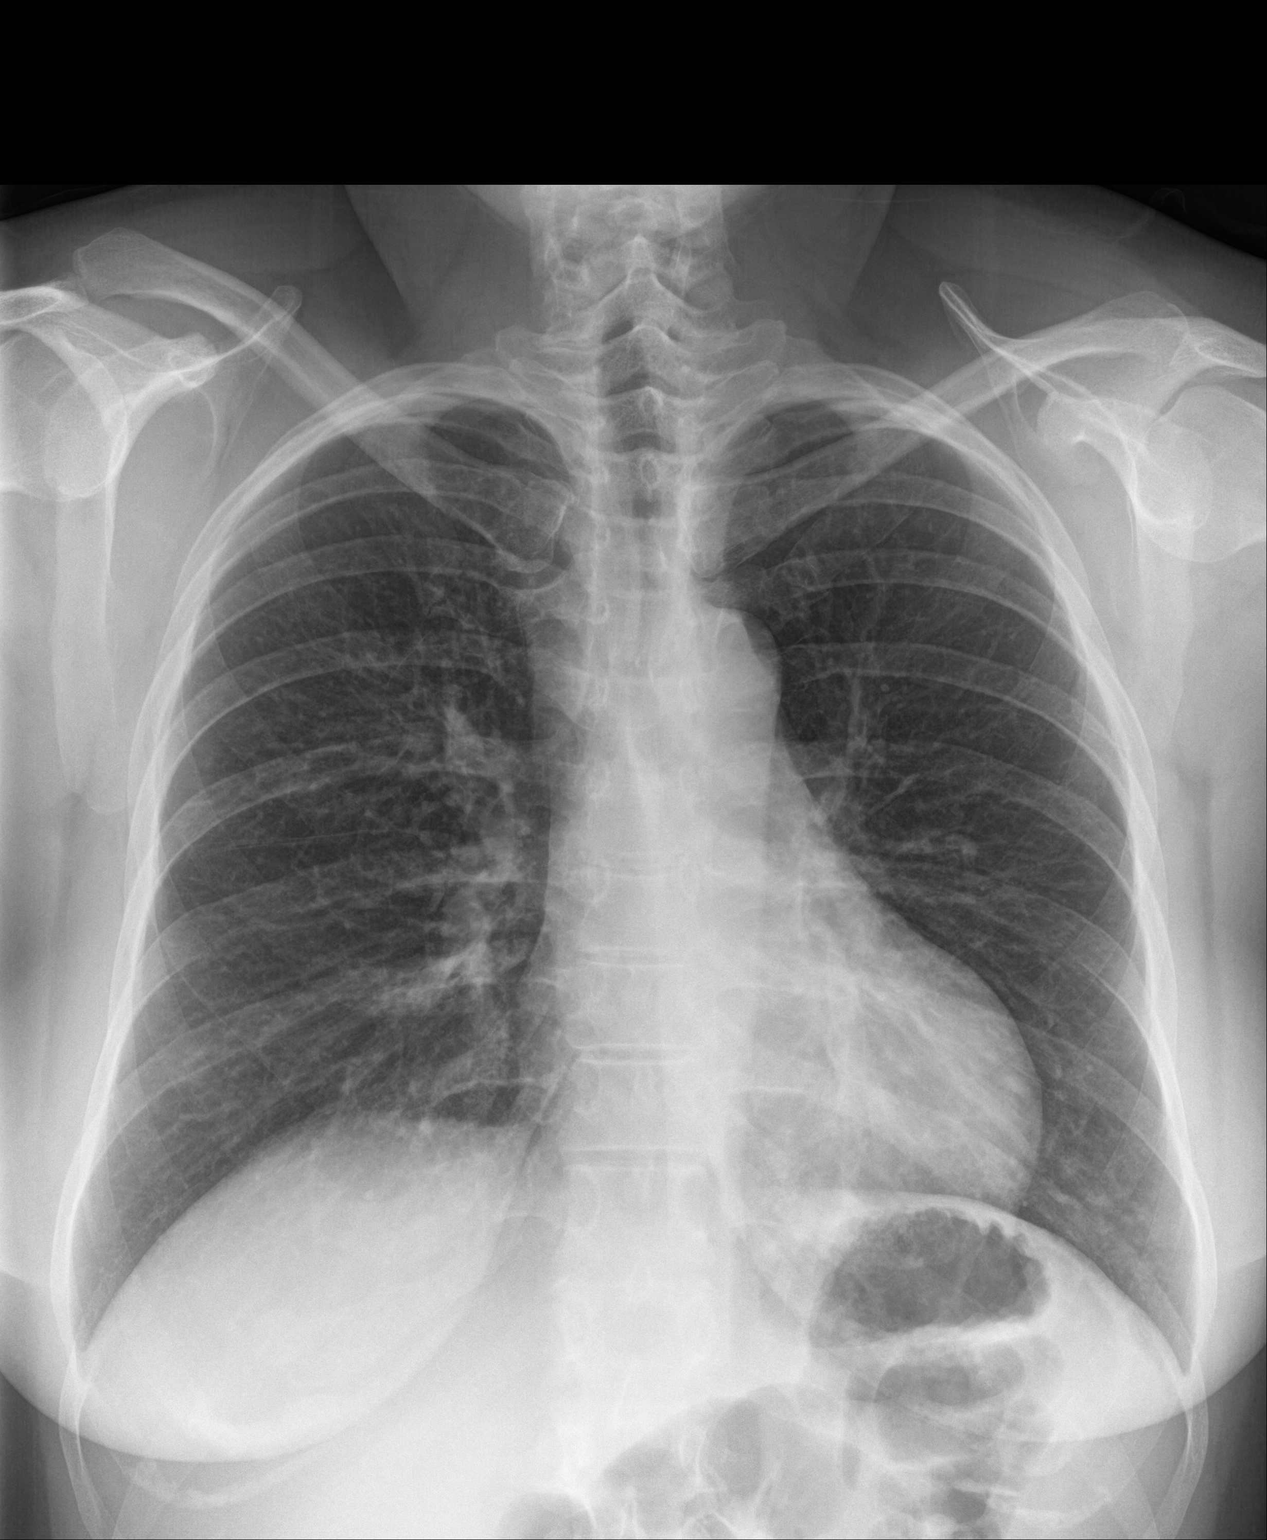

[chest lat]
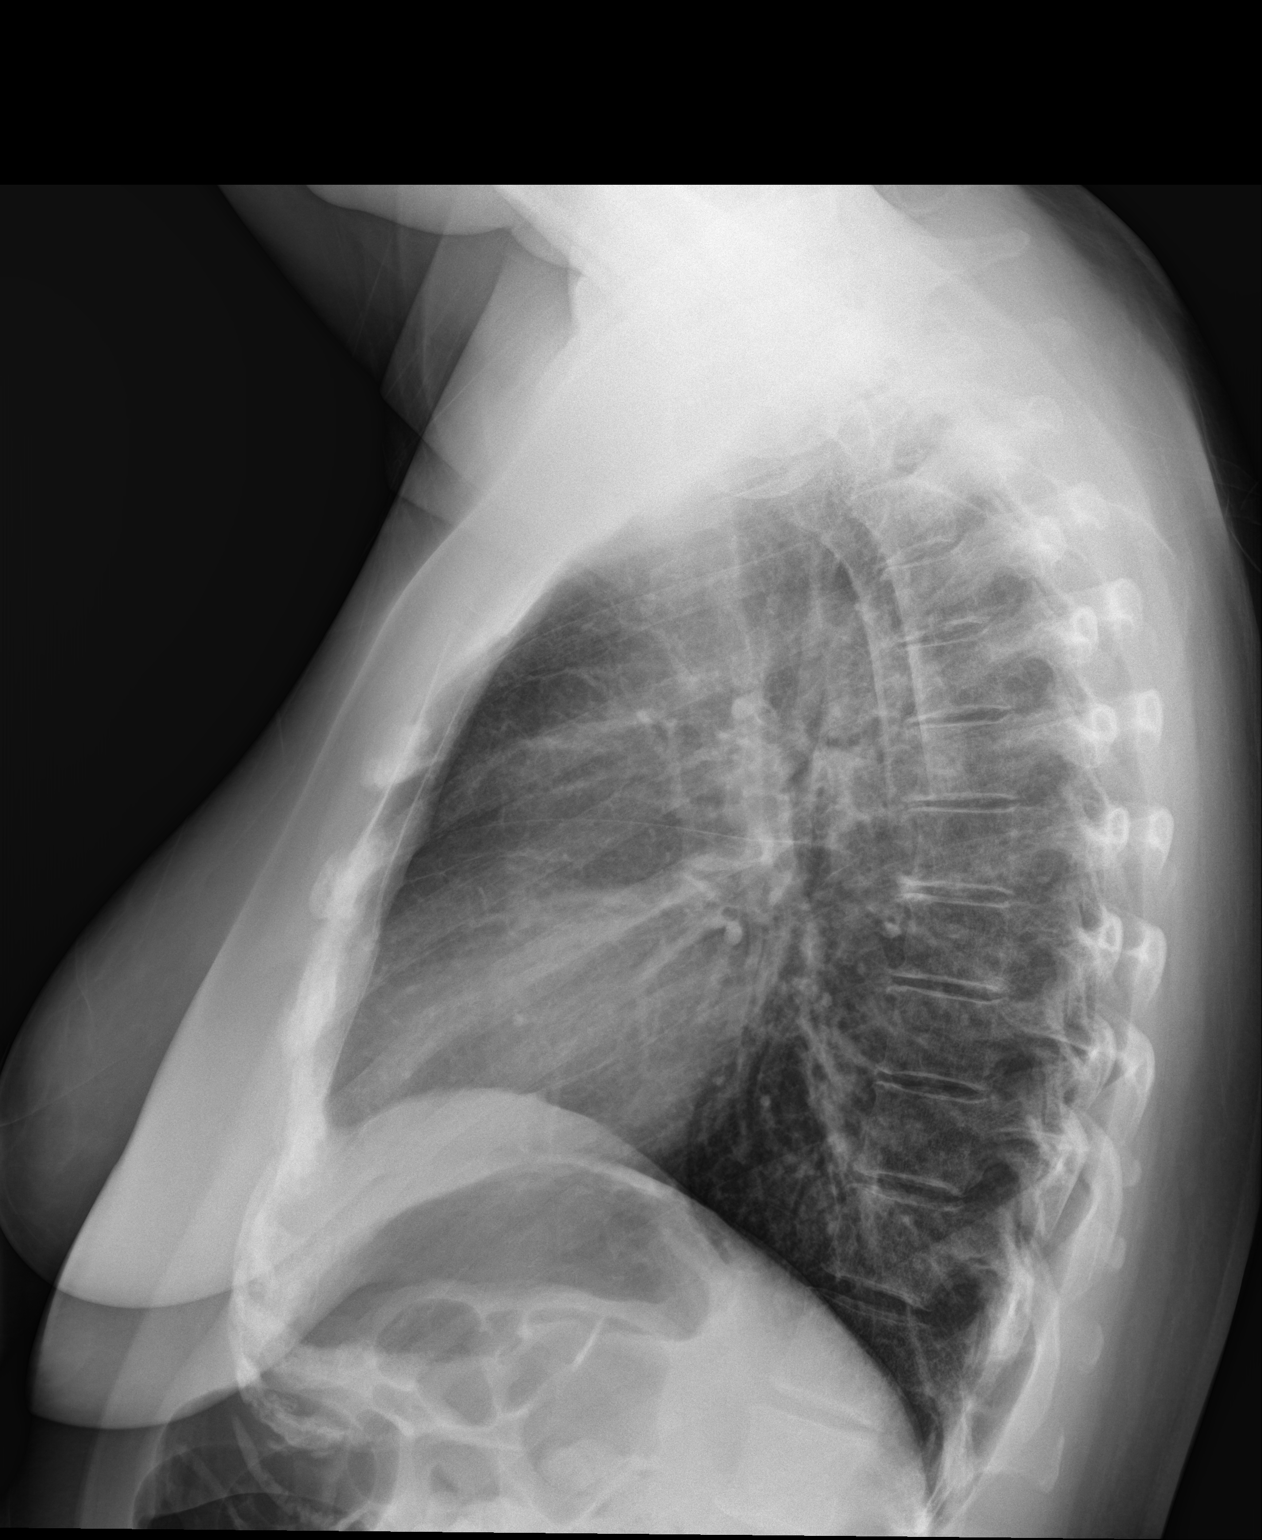

[2 of 2 positions shown; findings below may reference images not displayed]

FINDINGS: The heart size and mediastinal contours are within normal limits.
Both lungs are clear. The visualized skeletal structures are
unremarkable.
IMPRESSION: No active cardiopulmonary disease.

## 2018-08-24 ENCOUNTER — Other Ambulatory Visit: Payer: Self-pay | Admitting: Internal Medicine

## 2018-08-24 DIAGNOSIS — E538 Deficiency of other specified B group vitamins: Secondary | ICD-10-CM

## 2018-08-25 ENCOUNTER — Encounter: Payer: Self-pay | Admitting: Internal Medicine

## 2018-08-28 NOTE — Telephone Encounter (Signed)
Spoke with patient and she didn't realize it has been that long since she was seen here. Sent her to front desk to schedule next available CPE. She has not had labs, papsmear or mammos since she seen you. Does not have OBGYN.

## 2018-09-07 ENCOUNTER — Other Ambulatory Visit: Payer: Self-pay | Admitting: Internal Medicine

## 2018-09-07 ENCOUNTER — Ambulatory Visit: Payer: BC Managed Care – PPO | Admitting: Internal Medicine

## 2018-09-07 ENCOUNTER — Encounter: Payer: Self-pay | Admitting: Internal Medicine

## 2018-09-07 VITALS — BP 112/70 | HR 69 | Ht 62.0 in | Wt 190.0 lb

## 2018-09-07 DIAGNOSIS — N6002 Solitary cyst of left breast: Secondary | ICD-10-CM | POA: Diagnosis not present

## 2018-09-07 DIAGNOSIS — E538 Deficiency of other specified B group vitamins: Secondary | ICD-10-CM | POA: Diagnosis not present

## 2018-09-07 DIAGNOSIS — Z23 Encounter for immunization: Secondary | ICD-10-CM | POA: Diagnosis not present

## 2018-09-07 DIAGNOSIS — Z1239 Encounter for other screening for malignant neoplasm of breast: Secondary | ICD-10-CM

## 2018-09-07 DIAGNOSIS — Z1231 Encounter for screening mammogram for malignant neoplasm of breast: Secondary | ICD-10-CM | POA: Diagnosis not present

## 2018-09-07 MED ORDER — CYANOCOBALAMIN 1000 MCG/ML IJ SOLN: 1000.0000 ug | INTRAMUSCULAR | 1 refills | Status: DC

## 2018-09-07 NOTE — Progress Notes (Signed)
Date:  09/07/2018   Name:  Melinda Nelson   DOB:  31-Mar-1976   MRN:  [id number]   Chief Complaint: b12 def (b12 shot) and Immunizations (Flu shot.)  HPI B12 def - pt taking B12 injections at home.  Has been evaluated for neuropathy felt to be partly due to B12.  She also has fatigue and decreased concentration when she is due for an injection. She is feeling fairly well, menses are regular.  She does not have a GYN. She has not had a mammogram in the past year. [facility] was following a cyst on the left. She does not feel any mass or have any pain.  Review of Systems  Constitutional: Positive for fatigue (at times). Negative for fever and unexpected weight change.  HENT: Negative for tinnitus and trouble swallowing.   Respiratory: Negative for cough, chest tightness and shortness of breath.   Cardiovascular: Positive for palpitations (occasional). Negative for chest pain and leg swelling.  Gastrointestinal: Negative for abdominal pain.  Genitourinary: Negative for dysuria and hematuria.  Musculoskeletal: Negative for arthralgias.  Neurological: Negative for tremors, numbness and headaches.  Psychiatric/Behavioral: Negative for dysphoric mood.    Patient Active Problem List   Diagnosis Date Noted  . Breast cyst, left 09/07/2018  . Vitamin D deficiency 05/20/2016  . Vitamin B12 deficiency 05/20/2016  . 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 Neuropathy 05/20/2016  . IBS (irritable bowel syndrome) 02/19/2016    Allergies  Allergen Reactions  . Lamisil [Terbinafine]     Skin reaction  . Penicillins     Rash  . Tramadol Nausea And Vomiting    Past Surgical History:  Procedure Laterality Date  . CESAREAN SECTION     x 3  . TUBAL LIGATION  2009    Social History   Tobacco Use  . Smoking status: Never Smoker  . Smokeless tobacco: Never Used  Substance Use Topics  . Alcohol use: Yes    Alcohol/week: 0.0 standard drinks    Comment: rarely  . Drug use: No      Medication list has been reviewed and updated.  Current Meds  Medication Sig  . cholecalciferol (VITAMIN D) 1000 units tablet Take 1,000 Units by mouth daily.  . cyanocobalamin (,VITAMIN B-12,) 1000 MCG/ML injection Inject 1 mL (1,000 mcg total) into the muscle every 14 (fourteen) days.  . [DISCONTINUED] cyanocobalamin (,VITAMIN B-12,) 1000 MCG/ML injection INJECT 1 ML (1,000 MCG TOTAL) INTO THE MUSCLE EVERY 14 (FOURTEEN) DAYS.    PHQ 2/9 Scores 09/07/2018  PHQ - 2 Score 0    Physical Exam  Constitutional: She is oriented to person, place, and time. She appears well-developed. No distress.  HENT:  Head: Normocephalic and atraumatic.  Neck: Normal range of motion. Neck supple.  Cardiovascular: Normal rate, regular rhythm and normal heart sounds.  Pulmonary/Chest: Effort normal and breath sounds normal. No respiratory distress.  Musculoskeletal: She exhibits no edema.  Neurological: She is alert and oriented to person, place, and time.  Skin: Skin is warm and dry. No rash noted.  Psychiatric: She has a normal mood and affect. Her behavior is normal. Thought content normal.  Nursing note and vitals reviewed.   BP 112/70 (BP Location: Right Arm, Patient Position: Sitting, Cuff Size: Normal)   Pulse 69   Ht 5\' 2"  (1.575 m)   Wt 190 lb (86.2 kg)   SpO2 100%   BMI 34.75 kg/m   Assessment and Plan: 1. Vitamin B12 deficiency Continue monthly injections - cyanocobalamin (,  VITAMIN B-12,) 1000 MCG/ML injection; Inject 1 mL (1,000 mcg total) into the muscle every 14 (fourteen) days.  Dispense: 10 mL; Refill: 1  2. Breast cancer screening Schedule at [facility] - MM 3D SCREEN BREAST BILATERAL  3. Breast cyst, left  4. Need for influenza vaccination - Flu Vaccine QUAD 36+ mos IM   Partially dictated using Animal nutritionist. Any errors are unintentional.  Bari Edward, MD [facility]  09/07/2018

## 2018-10-22 ENCOUNTER — Ambulatory Visit
Admission: EM | Admit: 2018-10-22 | Discharge: 2018-10-22 | Disposition: A | Discharge status: Home / Self Care | Payer: BC Managed Care – PPO | Attending: Family Medicine | Admitting: Family Medicine

## 2018-10-22 ENCOUNTER — Other Ambulatory Visit: Payer: Self-pay

## 2018-10-22 ENCOUNTER — Encounter: Payer: Self-pay | Admitting: Emergency Medicine

## 2018-10-22 DIAGNOSIS — R319 Hematuria, unspecified: Secondary | ICD-10-CM

## 2018-10-22 DIAGNOSIS — N390 Urinary tract infection, site not specified: Secondary | ICD-10-CM

## 2018-10-22 DIAGNOSIS — S39012A Strain of muscle, fascia and tendon of lower back, initial encounter: Secondary | ICD-10-CM

## 2018-10-22 LAB — URINALYSIS, COMPLETE (UACMP) WITH MICROSCOPIC
RBC / HPF: >50 RBC/hpf (ref 0–5)
WBC, UA: >50 WBC/hpf (ref 0–5)

## 2018-10-22 MED ORDER — NITROFURANTOIN MONOHYD MACRO 100 MG PO CAPS: 100.0000 mg | ORAL_CAPSULE | Freq: Two times a day (BID) | ORAL | 0 refills | Status: DC

## 2018-10-22 MED ORDER — CYCLOBENZAPRINE HCL 10 MG PO TABS: 10.0000 mg | ORAL_TABLET | Freq: Every day | ORAL | 0 refills | Status: DC

## 2018-10-22 NOTE — ED Triage Notes (Addendum)
Patient c/o dysuria and urinary frequency that started this morning. Patient stated she took AZO at 11:45am this morning. Patient also c/o muscle spasm in her back that started yesterday.

## 2018-10-22 NOTE — ED Provider Notes (Signed)
[facility] ____________________________________________  Time seen: Approximately 4:16 PM  I have reviewed the triage vital signs and the nursing notes.   HISTORY  Chief Complaint Dysuria; Urinary Frequency; and Muscle Pain   HPI Melinda Nelson is a 42 y.o. female presenting for evaluation of urinary frequency, urinary urgency and some discomfort with urination present since last night.  Reports also having some pressure suprapubic consistent with previous UTIs.  Reports she has had recent diarrhea and recent sexual intercourse with her spouse, and states possible concerns for a UTI risk factors.  Continues to eat and drink well.  Denies vaginal discomfort or vaginal discharge.  Denies abdominal pain.  Does also reports some back pain, but reports she does not feel like this is affiliated with urinary symptoms as she states that she does have chronic degenerative disc disease with intermittent flareups.  Patient reports few days ago she was working at home as they are building a house, and states that she stepped on a wheel causing her to quickly twist her back and strain it.  States that she did not have any fall or direct hit trauma.  Continues to remain active and with full range of motion but pain and catching spasm with range of motion, particularly bending and standing.  Denies pain radiation, paresthesias, urinary or bowel retention or incontinence.  States this feels similar to flareup of back pain.  Denies known fevers.  States in the past steroids and muscle relaxants help with her back pain.  Denies other aggravating alleviating factors.  Reports otherwise feels well.   Reubin Milan, MD: PCP   Past Medical History:  Diagnosis Date  . B12 deficiency   . Degenerative disc disease, lumbar    L4-L5  . Inflamed external hemorrhoid 10/26/2016    Patient Active Problem List   Diagnosis Date Noted  . Breast cyst, left 09/07/2018  . Vitamin D deficiency 05/20/2016  .  Vitamin B12 deficiency 05/20/2016  . 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 Neuropathy 05/20/2016  . IBS (irritable bowel syndrome) 02/19/2016    Past Surgical History:  Procedure Laterality Date  . CESAREAN SECTION     x 3  . TUBAL LIGATION  2009     No current facility-administered medications for this encounter.   Current Outpatient Medications:  .  cholecalciferol (VITAMIN D) 1000 units tablet, Take 1,000 Units by mouth daily., Disp: , Rfl:  .  cyanocobalamin (,VITAMIN B-12,) 1000 MCG/ML injection, Inject 1 mL (1,000 mcg total) into the muscle every 14 (fourteen) days., Disp: 10 mL, Rfl: 1 .  cyclobenzaprine (FLEXERIL) 10 MG tablet, Take 1 tablet (10 mg total) by mouth at bedtime. Do not drive while taking as can cause drowsiness, Disp: 10 tablet, Rfl: 0 .  nitrofurantoin, macrocrystal-monohydrate, (MACROBID) 100 MG capsule, Take 1 capsule (100 mg total) by mouth 2 (two) times daily., Disp: 10 capsule, Rfl: 0  Allergies Lamisil [terbinafine]; Penicillins; and Tramadol  Family History  Problem Relation Age of Onset  . Hypertension Mother   . Hypertension Father   . Barrett's esophagus Father     Social History Social History   Tobacco Use  . Smoking status: Never Smoker  . Smokeless tobacco: Never Used  Substance Use Topics  . Alcohol use: Yes    Alcohol/week: 0.0 standard drinks    Comment: rarely  . Drug use: No    Review of Systems Constitutional: No fever. Cardiovascular: Denies chest pain. Respiratory: Denies shortness of breath. Gastrointestinal: No abdominal pain. No  nausea, no vomiting. No diarrhea.   Genitourinary: positive for dysuria. Musculoskeletal: positive for back pain. Skin: Negative for rash.   ____________________________________________   PHYSICAL EXAM:  VITAL SIGNS: ED Triage Vitals  Enc Vitals Group     BP 10/22/18 1437 (!) 144/100     Pulse Rate 10/22/18 1437 67     Resp 10/22/18 1437 18     Temp 10/22/18 1437 98.9 F (37.2  C)     Temp Source 10/22/18 1437 Oral     SpO2 10/22/18 1437 100 %     Weight 10/22/18 1436 190 lb (86.2 kg)     Height 10/22/18 1436 5\' 2"  (1.575 m)     Head Circumference --      Peak Flow --      Pain Score 10/22/18 1436 2     Pain Loc --      Pain Edu? --      Excl. in GC? --     Constitutional: Alert and oriented. Well appearing and in no acute distress. Eyes: Conjunctivae are normal.  ENT      Head: Normocephalic and atraumatic. Cardiovascular: Normal rate, regular rhythm. Grossly normal heart sounds.  Good peripheral circulation. Respiratory: Normal respiratory effort without tachypnea nor retractions. Breath sounds are clear and equal bilaterally. No wheezes, rales, rhonchi. Gastrointestinal: Minimal midline suprapubic tenderness.  Abdomen otherwise soft and nontender.  No CVA tenderness. Musculoskeletal:  No midline cervical, thoracic or lumbar tenderness to palpation. Bilateral pedal pulses equal and easily palpated.  Mild left lower left para lumbar tenderness to palpation, no point midline tenderness, pain increases with lumbar flexion and extension as well as mildly with right and left rotation but full range of motion present, no saddle anesthesia, no pain with standing bilateral knee lifts, steady gait.  Changes positions quickly in room. Neurologic:  Normal speech and language. No gross focal neurologic deficits are appreciated. Speech is normal. No gait instability.  Skin:  Skin is warm, dry and intact. No rash noted. Psychiatric: Mood and affect are normal. Speech and behavior are normal. Patient exhibits appropriate insight and judgment   ___________________________________________   LABS (all labs ordered are listed, but only abnormal results are displayed)  Labs Reviewed  URINALYSIS, COMPLETE (UACMP) WITH MICROSCOPIC - Abnormal; Notable for the following components:      Result Value   Color, Urine ORANGE (*)    APPearance CLOUDY (*)    Glucose, UA   (*)     Value: TEST NOT REPORTED DUE TO COLOR INTERFERENCE OF URINE PIGMENT   Hgb urine dipstick   (*)    Value: TEST NOT REPORTED DUE TO COLOR INTERFERENCE OF URINE PIGMENT   Bilirubin Urine   (*)    Value: TEST NOT REPORTED DUE TO COLOR INTERFERENCE OF URINE PIGMENT   Ketones, ur   (*)    Value: TEST NOT REPORTED DUE TO COLOR INTERFERENCE OF URINE PIGMENT   Protein, ur   (*)    Value: TEST NOT REPORTED DUE TO COLOR INTERFERENCE OF URINE PIGMENT   Nitrite   (*)    Value: TEST NOT REPORTED DUE TO COLOR INTERFERENCE OF URINE PIGMENT   Leukocytes, UA   (*)    Value: TEST NOT REPORTED DUE TO COLOR INTERFERENCE OF URINE PIGMENT   Bacteria, UA FEW (*)    All other components within normal limits  URINE CULTURE    PROCEDURES Procedures    INITIAL IMPRESSION / ASSESSMENT AND PLAN / ED COURSE  Pertinent labs &  imaging results that were available during my care of the patient were reviewed by me and considered in my medical decision making (see chart for details).  Well-appearing patient. No acute distress. Urinalysis reviewed, suspect UTI, will culture. Will treat with oral Macrobid. Suspect acute strain low back.  Discussed over-the-counter ibuprofen and Rx PRN Flexeril.  Encourage rest, fluids, supportive care and avoidance of triggers.  Follow-up with primary care as needed.Discussed indication, risks and benefits of medications with patient.   Discussed follow up and return parameters including no resolution or any worsening concerns. Patient verbalized understanding and agreed to plan.   ____________________________________________   FINAL CLINICAL IMPRESSION(S) / ED DIAGNOSES  Final diagnoses:  Urinary tract infection with hematuria, site unspecified  Strain of lumbar region, initial encounter     ED Discharge Orders         Ordered    nitrofurantoin, macrocrystal-monohydrate, (MACROBID) 100 MG capsule  2 times daily     10/22/18 1551    cyclobenzaprine (FLEXERIL) 10 MG tablet   Daily at bedtime     10/22/18 1551           Note: This dictation was prepared with Dragon dictation along with smaller phrase technology. Any transcriptional errors that result from this process are unintentional.         Renford Dills, NP 10/22/18 1723

## 2018-10-22 NOTE — Discharge Instructions (Addendum)
Take medication as prescribed. Rest. Drink plenty of fluids. Stretch. Avoid aggravating injury.   Follow up with your primary care physician this week as needed. Return to Urgent care for new or worsening concerns.

## 2018-10-24 LAB — URINE CULTURE

## 2018-12-26 ENCOUNTER — Encounter: Payer: Self-pay | Admitting: Internal Medicine

## 2018-12-26 ENCOUNTER — Other Ambulatory Visit: Payer: Self-pay | Admitting: Internal Medicine

## 2018-12-26 ENCOUNTER — Ambulatory Visit: Payer: BC Managed Care – PPO | Admitting: Internal Medicine

## 2018-12-26 VITALS — BP 138/78 | HR 90 | Temp 98.5°F | Ht 62.0 in | Wt 195.6 lb

## 2018-12-26 DIAGNOSIS — N3000 Acute cystitis without hematuria: Secondary | ICD-10-CM

## 2018-12-26 LAB — POC URINALYSIS WITH MICROSCOPIC (NON AUTO)MANUAL RESULT
Bilirubin, UA: NEGATIVE
Crystals: 0
Epithelial cells, urine per micros: 5
GLUCOSE UA: NEGATIVE
KETONES UA: NEGATIVE
Mucus, UA: 0
NITRITE UA: POSITIVE
Protein, UA: POSITIVE — AB
RBC: 30 M/uL — AB (ref 4.04–5.48)
SPEC GRAV UA: 1.02 (ref 1.010–1.025)
UROBILINOGEN UA: 0.2 U/dL
WBC Casts, UA: 50
pH, UA: 6 (ref 5.0–8.0)

## 2018-12-26 MED ORDER — SULFAMETHOXAZOLE-TRIMETHOPRIM 800-160 MG PO TABS: 1.0000 | ORAL_TABLET | Freq: Two times a day (BID) | ORAL | 0 refills | Status: AC

## 2018-12-26 NOTE — Progress Notes (Signed)
Date:  12/26/2018   Name:  Melinda Nelson   DOB:  Apr 19, 1976   MRN:  [id number]   Chief Complaint: Urinary Tract Infection (Pelvic pain and discomfort. Took Azo today at 1:30PM. Fighting them off for the past month. Drinking a lot of water and taking Vit C. This morning felt this UTI. )  Urinary Tract Infection   This is a recurrent problem. The current episode started more than 1 month ago. The problem occurs intermittently. The problem has been rapidly worsening (much worse yesterday and today). The pain is moderate. There has been no fever. Associated symptoms include chills, frequency, hematuria and urgency. Pertinent negatives include no flank pain or nausea. She has tried increased fluids and home medications for the symptoms. The treatment provided mild relief.    Review of Systems  Constitutional: Positive for chills, diaphoresis and fatigue. Negative for fever.  HENT: Positive for congestion and postnasal drip. Negative for sinus pressure and trouble swallowing.   Respiratory: Positive for cough. Negative for chest tightness, shortness of breath and wheezing.   Cardiovascular: Negative for chest pain and palpitations.  Gastrointestinal: Positive for abdominal distention. Negative for abdominal pain, constipation and nausea.  Genitourinary: Positive for frequency, hematuria and urgency. Negative for flank pain.  Neurological: Negative for dizziness and headaches.    Patient Active Problem List   Diagnosis Date Noted  . Breast cyst, left 09/07/2018  . Vitamin D deficiency 05/20/2016  . Vitamin B12 deficiency 05/20/2016  . 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 Neuropathy 05/20/2016  . IBS (irritable bowel syndrome) 02/19/2016    Allergies  Allergen Reactions  . Lamisil [Terbinafine]     Skin reaction  . Penicillins     Rash  . Tramadol Nausea And Vomiting    Past Surgical History:  Procedure Laterality Date  . CESAREAN SECTION     x 3  . TUBAL LIGATION  2009     Social History   Tobacco Use  . Smoking status: Never Smoker  . Smokeless tobacco: Never Used  Substance Use Topics  . Alcohol use: Yes    Alcohol/week: 0.0 standard drinks    Comment: rarely  . Drug use: No     Medication list has been reviewed and updated.  Current Meds  Medication Sig  . Ascorbic Acid (VITAMIN C) 1000 MG tablet Take 1,000 mg by mouth daily.  . Homeopathic Products (AZO CONFIDENCE PO) Take by mouth.  . Pseudoephedrine HCl (DECONGESTANT 60 PO) Take by mouth.    PHQ 2/9 Scores 09/07/2018  PHQ - 2 Score 0    Physical Exam Vitals signs and nursing note reviewed.  Constitutional:      Appearance: She is well-developed.  HENT:     Right Ear: Tympanic membrane is retracted. Tympanic membrane is not erythematous.     Left Ear: Tympanic membrane is retracted. Tympanic membrane is not erythematous.  Cardiovascular:     Rate and Rhythm: Normal rate and regular rhythm.     Heart sounds: Normal heart sounds.  Pulmonary:     Effort: Pulmonary effort is normal. No respiratory distress.     Breath sounds: Normal breath sounds.  Abdominal:     General: Bowel sounds are normal.     Palpations: Abdomen is soft.     Tenderness: There is abdominal tenderness in the suprapubic area. There is no guarding or rebound.     BP 138/78   Pulse 90   Temp 98.5 F (36.9 C) (Oral)   Ht 5'  2" (1.575 m)   Wt 195 lb 9.6 oz (88.7 kg)   LMP 11/25/2018 (Exact Date)   SpO2 100%   BMI 35.78 kg/m   Assessment and Plan: 1. Acute cystitis without hematuria Continue to increase fluids AZO x 3 days if needed for pain - sulfamethoxazole-trimethoprim (BACTRIM DS,SEPTRA DS) 800-160 MG tablet; Take 1 tablet by mouth 2 (two) times daily for 10 days.  Dispense: 20 tablet; Refill: 0 - Urine Culture - POC urinalysis w microscopic (non auto)   Partially dictated using Animal nutritionistDragon software. Any errors are unintentional.  Bari EdwardLaura Quamir Willemsen, MD [facility]  12/26/2018

## 2018-12-28 LAB — URINE CULTURE

## 2019-02-20 ENCOUNTER — Encounter: Payer: Self-pay | Admitting: Internal Medicine

## 2019-02-20 ENCOUNTER — Ambulatory Visit (INDEPENDENT_AMBULATORY_CARE_PROVIDER_SITE_OTHER): Payer: BC Managed Care – PPO | Admitting: Internal Medicine

## 2019-02-20 ENCOUNTER — Other Ambulatory Visit: Payer: Self-pay

## 2019-02-20 VITALS — BP 128/64 | HR 65 | Ht 62.0 in | Wt 197.0 lb

## 2019-02-20 DIAGNOSIS — K589 Irritable bowel syndrome without diarrhea: Secondary | ICD-10-CM | POA: Diagnosis not present

## 2019-02-20 DIAGNOSIS — Z0000 Encounter for general adult medical examination without abnormal findings: Secondary | ICD-10-CM | POA: Diagnosis not present

## 2019-02-20 DIAGNOSIS — E538 Deficiency of other specified B group vitamins: Secondary | ICD-10-CM

## 2019-02-20 DIAGNOSIS — Z23 Encounter for immunization: Secondary | ICD-10-CM | POA: Diagnosis not present

## 2019-02-20 LAB — POCT URINALYSIS DIPSTICK
BILIRUBIN UA: NEGATIVE
Blood, UA: NEGATIVE
GLUCOSE UA: NEGATIVE
KETONES UA: NEGATIVE
Leukocytes, UA: NEGATIVE
Nitrite, UA: NEGATIVE
PH UA: 7.5 (ref 5.0–8.0)
Protein, UA: NEGATIVE
Spec Grav, UA: 1.015 (ref 1.010–1.025)
UROBILINOGEN UA: 0.2 U/dL

## 2019-02-20 NOTE — Patient Instructions (Signed)
Tdap Vaccine (Tetanus, Diphtheria and Pertussis): What You Need to Know  1. Why get vaccinated?  Tetanus, diphtheria and pertussis are very serious diseases. Tdap vaccine can protect us from these diseases. And, Tdap vaccine given to pregnant women can protect newborn babies against pertussis..  TETANUS (Lockjaw) is rare in the [place] today. It causes painful muscle tightening and stiffness, usually all over the body.  · It can lead to tightening of muscles in the head and neck so you can't open your mouth, swallow, or sometimes even breathe. Tetanus kills about 1 out of 10 people who are infected even after receiving the best medical care.  DIPHTHERIA is also rare in the [place] today. It can cause a thick coating to form in the back of the throat.  · It can lead to breathing problems, heart failure, paralysis, and death.  PERTUSSIS (Whooping Cough) causes severe coughing spells, which can cause difficulty breathing, vomiting and disturbed sleep.  · It can also lead to weight loss, incontinence, and rib fractures. Up to 2 in 100 adolescents and 5 in 100 adults with pertussis are hospitalized or have complications, which could include pneumonia or death.  These diseases are caused by bacteria. Diphtheria and pertussis are spread from person to person through secretions from coughing or sneezing. Tetanus enters the body through cuts, scratches, or wounds.  Before vaccines, as many as 200,000 cases of diphtheria, 200,000 cases of pertussis, and hundreds of cases of tetanus, were reported in the [place] each year. Since vaccination began, reports of cases for tetanus and diphtheria have dropped by about 99% and for pertussis by about 80%.  2. Tdap vaccine  Tdap vaccine can protect adolescents and adults from tetanus, diphtheria, and pertussis. One dose of Tdap is routinely given at age 11 or 12. People who did not get Tdap at that age should get it as soon as possible.  Tdap is especially important  for healthcare professionals and anyone having close contact with a baby younger than 12 months.  Pregnant women should get a dose of Tdap during every pregnancy, to protect the newborn from pertussis. Infants are most at risk for severe, life-threatening complications from pertussis.  Another vaccine, called Td, protects against tetanus and diphtheria, but not pertussis. A Td booster should be given every 10 years. Tdap may be given as one of these boosters if you have never gotten Tdap before. Tdap may also be given after a severe cut or burn to prevent tetanus infection.  Your doctor or the person giving you the vaccine can give you more information.  Tdap may safely be given at the same time as other vaccines.  3. Some people should not get this vaccine  · A person who has ever had a life-threatening allergic reaction after a previous dose of any diphtheria, tetanus or pertussis containing vaccine, OR has a severe allergy to any part of this vaccine, should not get Tdap vaccine. Tell the person giving the vaccine about any severe allergies.  · Anyone who had coma or long repeated seizures within 7 days after a childhood dose of DTP or DTaP, or a previous dose of Tdap, should not get Tdap, unless a cause other than the vaccine was found. They can still get Td.  · Talk to your doctor if you:  ? have seizures or another nervous system problem,  ? had severe pain or swelling after any vaccine containing diphtheria, tetanus or pertussis,  ? ever had a condition   called Guillain-Barré Syndrome (GBS),  ? aren't feeling well on the day the shot is scheduled.  4. Risks  With any medicine, including vaccines, there is a chance of side effects. These are usually mild and go away on their own. Serious reactions are also possible but are rare.  Most people who get Tdap vaccine do not have any problems with it.  Mild problems following Tdap  (Did not interfere with activities)  · Pain where the shot was given (about 3 in 4  adolescents or 2 in 3 adults)  · Redness or swelling where the shot was given (about 1 person in 5)  · Mild fever of at least 100.4°F (up to about 1 in 25 adolescents or 1 in 100 adults)  · Headache (about 3 or 4 people in 10)  · Tiredness (about 1 person in 3 or 4)  · Nausea, vomiting, diarrhea, stomach ache (up to 1 in 4 adolescents or 1 in 10 adults)  · Chills, sore joints (about 1 person in 10)  · Body aches (about 1 person in 3 or 4)  · Rash, swollen glands (uncommon)  Moderate problems following Tdap  (Interfered with activities, but did not require medical attention)  · Pain where the shot was given (up to 1 in 5 or 6)  · Redness or swelling where the shot was given (up to about 1 in 16 adolescents or 1 in 12 adults)  · Fever over 102°F (about 1 in 100 adolescents or 1 in 250 adults)  · Headache (about 1 in 7 adolescents or 1 in 10 adults)  · Nausea, vomiting, diarrhea, stomach ache (up to 1 or 3 people in 100)  · Swelling of the entire arm where the shot was given (up to about 1 in 500).  Severe problems following Tdap  (Unable to perform usual activities; required medical attention)  · Swelling, severe pain, bleeding and redness in the arm where the shot was given (rare).  Problems that could happen after any vaccine:  · People sometimes faint after a medical procedure, including vaccination. Sitting or lying down for about 15 minutes can help prevent fainting, and injuries caused by a fall. Tell your doctor if you feel dizzy, or have vision changes or ringing in the ears.  · Some people get severe pain in the shoulder and have difficulty moving the arm where a shot was given. This happens very rarely.  · Any medication can cause a severe allergic reaction. Such reactions from a vaccine are very rare, estimated at fewer than 1 in a million doses, and would happen within a few minutes to a few hours after the vaccination.  As with any medicine, there is a very remote chance of a vaccine causing a serious  injury or death.  The safety of vaccines is always being monitored. For more information, visit: www.cdc.gov/vaccinesafety/  5. What if there is a serious problem?  What should I look for?  · Look for anything that concerns you, such as signs of a severe allergic reaction, very high fever, or unusual behavior.  Signs of a severe allergic reaction can include hives, swelling of the face and throat, difficulty breathing, a fast heartbeat, dizziness, and weakness. These would usually start a few minutes to a few hours after the vaccination.  What should I do?  · If you think it is a severe allergic reaction or other emergency that can't wait, call 9-1-1 or get the person to the nearest hospital. Otherwise,   call your doctor.  · Afterward, the reaction should be reported to the Vaccine Adverse Event Reporting System (VAERS). Your doctor might file this report, or you can do it yourself through the VAERS web site at www.vaers.hhs.gov, or by calling 1-800-822-7967.  VAERS does not give medical advice.  6. The National Vaccine Injury Compensation Program  The National Vaccine Injury Compensation Program (VICP) is a federal program that was created to compensate people who may have been injured by certain vaccines.  Persons who believe they may have been injured by a vaccine can learn about the program and about filing a claim by calling 1-800-338-2382 or visiting the VICP website at www.hrsa.gov/vaccinecompensation. There is a time limit to file a claim for compensation.  7. How can I learn more?  · Ask your doctor. He or she can give you the vaccine package insert or suggest other sources of information.  · Call your local or state health department.  · Contact the Centers for Disease Control and Prevention (CDC):  ? Call 1-800-232-4636 (1-800-CDC-INFO) or  ? Visit CDC's website at www.cdc.gov/vaccines  Vaccine Information Statement Tdap Vaccine (02/04/2014)  This information is not intended to replace advice given to you  by your health care provider. Make sure you discuss any questions you have with your health care provider.  Document Released: 05/29/2012 Document Revised: 07/16/2018 Document Reviewed: 07/16/2018  Elsevier Interactive Patient Education © 2019 [employer]

## 2019-02-20 NOTE — Progress Notes (Signed)
Date:  02/20/2019   Name:  Melinda Nelson   DOB:  1976/09/04   MRN:  [id number]   Chief Complaint: Annual Exam Melinda Nelson is a 43 y.o. female who presents today for her Complete Annual Exam. She feels well. 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 She reports she is sleeping well.  She is staying busy with children in sports, renovating her house and working.  She is overdue for mammogram.  She denies breast issues. Tdap is due. She continues on monthly B12 injections at home. She has stable IBS symptoms that have not changed.  No rectal pain, bleeding, abdominal pain or unexplained weight loss. HPI  Review of Systems  Constitutional: Negative for chills, fatigue, fever and unexpected weight change.  HENT: Negative for congestion, hearing loss, tinnitus, trouble swallowing and voice change.   Eyes: Negative for visual disturbance.  Respiratory: Negative for cough, chest tightness, shortness of breath and wheezing.   Cardiovascular: Negative for chest pain, palpitations and leg swelling.  Gastrointestinal: Positive for constipation and diarrhea. Negative for abdominal pain, blood in stool, rectal pain and vomiting.  Endocrine: Negative for polydipsia and polyuria.  Genitourinary: Negative for dysuria, frequency, genital sores, vaginal bleeding and vaginal discharge.  Musculoskeletal: Negative for arthralgias, gait problem and joint swelling.  Skin: Negative for color change and rash.  Allergic/Immunologic: Negative for environmental allergies.  Neurological: Negative for dizziness, tremors, light-headedness and headaches.  Hematological: Negative for adenopathy. Does not bruise/bleed easily.  Psychiatric/Behavioral: Negative for dysphoric mood and sleep disturbance. The patient is not nervous/anxious.     Patient Active Problem List   Diagnosis Date Noted   Breast cyst, left 09/07/2018   Vitamin D deficiency 05/20/2016   Vitamin B12 deficiency 05/20/2016   Other  headache syndrome 05/20/2016   Neuropathy 05/20/2016   IBS (irritable bowel syndrome) 02/19/2016    Allergies  Allergen Reactions   Lamisil [Terbinafine]     Skin reaction   Penicillins     Rash   Tramadol Nausea And Vomiting    Past Surgical History:  Procedure Laterality Date   CESAREAN SECTION     x 3   TUBAL LIGATION  2009    Social History   Tobacco Use   Smoking status: Never Smoker   Smokeless tobacco: Never Used  Substance Use Topics   Alcohol use: Yes    Alcohol/week: 0.0 standard drinks    Comment: rarely   Drug use: No     Medication list has been reviewed and updated.  Current Meds  Medication Sig   Ascorbic Acid (VITAMIN C) 1000 MG tablet Take 1,000 mg by mouth daily.   cholecalciferol (VITAMIN D) 1000 units tablet Take 1,000 Units by mouth daily.   cyanocobalamin (,VITAMIN B-12,) 1000 MCG/ML injection Inject 1 mL (1,000 mcg total) into the muscle every 14 (fourteen) days.   Homeopathic Products (AZO CONFIDENCE PO) Take by mouth.   Pseudoephedrine HCl (DECONGESTANT 60 PO) Take by mouth.    PHQ 2/9 Scores 02/20/2019 09/07/2018  PHQ - 2 Score 0 0    Physical Exam Vitals signs and nursing note reviewed.  Constitutional:      General: She is not in acute distress.    Appearance: She is well-developed.  HENT:     Head: Normocephalic and atraumatic.     Right Ear: Tympanic membrane and ear canal normal.     Left Ear: Tympanic membrane and ear canal normal.     Nose:     Right  Sinus: No maxillary sinus tenderness.     Left Sinus: No maxillary sinus tenderness.     Mouth/Throat:     Pharynx: Uvula midline.  Eyes:     General: No scleral icterus.       Right eye: No discharge.        Left eye: No discharge.     Conjunctiva/sclera: Conjunctivae normal.  Neck:     Musculoskeletal: Normal range of motion. No erythema.     Thyroid: No thyromegaly.     Vascular: No carotid bruit.  Cardiovascular:     Rate and Rhythm: Normal rate  and regular rhythm.     Pulses: Normal pulses.     Heart sounds: Normal heart sounds.  Pulmonary:     Effort: Pulmonary effort is normal. No respiratory distress.     Breath sounds: No wheezing.  Chest:     Breasts:        Right: No mass, nipple discharge, skin change or tenderness.        Left: No mass, nipple discharge, skin change or tenderness.  Abdominal:     General: Bowel sounds are normal.     Palpations: Abdomen is soft.     Tenderness: There is no abdominal tenderness.  Musculoskeletal: Normal range of motion.  Lymphadenopathy:     Cervical: No cervical adenopathy.  Skin:    General: Skin is warm and dry.     Findings: No rash.  Neurological:     Mental Status: She is alert and oriented to person, place, and time.     Cranial Nerves: No cranial nerve deficit.     Sensory: No sensory deficit.     Deep Tendon Reflexes: Reflexes are normal and symmetric.  Psychiatric:        Speech: Speech normal.        Behavior: Behavior normal.        Thought Content: Thought content normal.     Wt Readings from Last 3 Encounters:  02/20/19 197 lb (89.4 kg)  12/26/18 195 lb 9.6 oz (88.7 kg)  10/22/18 190 lb (86.2 kg)    BP 128/64    Pulse 65    Ht 5\' 2"  (1.575 m)    Wt 197 lb (89.4 kg)    SpO2 98%    BMI 36.03 kg/m   Assessment and Plan: 1. Annual physical exam Normal exam except for weight - continue healthy diet and active lifestyle - Lipid panel - POCT urinalysis dipstick  2. Vitamin B12 deficiency Continue monthly injection - Vitamin B12  3. Irritable bowel syndrome, unspecified type Stable with no red flag symptoms - Comprehensive metabolic panel - CBC with Differential/Platelet - TSH  4. Need for diphtheria-tetanus-pertussis (Tdap) vaccine - Tdap vaccine greater than or equal to 7yo IM   Partially dictated using Animal nutritionist. Any errors are unintentional.  Bari Edward, MD [facility]  02/20/2019

## 2019-02-21 LAB — COMPREHENSIVE METABOLIC PANEL
ALBUMIN: 4.2 g/dL (ref 3.8–4.8)
ALK PHOS: 80 IU/L (ref 39–117)
ALT: 14 IU/L (ref 0–32)
AST: 16 IU/L (ref 0–40)
Albumin/Globulin Ratio: 1.7 (ref 1.2–2.2)
BILIRUBIN TOTAL: 0.3 mg/dL (ref 0.0–1.2)
BUN / CREAT RATIO: 10 (ref 9–23)
BUN: 9 mg/dL (ref 6–24)
CHLORIDE: 103 mmol/L (ref 96–106)
CO2: 22 mmol/L (ref 20–29)
Calcium: 8.9 mg/dL (ref 8.7–10.2)
Creatinine, Ser: 0.93 mg/dL (ref 0.57–1.00)
GFR calc Af Amer: 86 mL/min/{1.73_m2} (ref >59)
GFR calc non Af Amer: 75 mL/min/{1.73_m2} (ref >59)
GLOBULIN, TOTAL: 2.5 g/dL (ref 1.5–4.5)
Glucose: 86 mg/dL (ref 65–99)
Potassium: 4.2 mmol/L (ref 3.5–5.2)
SODIUM: 140 mmol/L (ref 134–144)
Total Protein: 6.7 g/dL (ref 6.0–8.5)

## 2019-02-21 LAB — CBC WITH DIFFERENTIAL/PLATELET
BASOS ABS: 0 10*3/uL (ref 0.0–0.2)
Basos: 1 %
EOS (ABSOLUTE): 0.2 10*3/uL (ref 0.0–0.4)
Eos: 5 %
Hematocrit: 35.1 % (ref 34.0–46.6)
Hemoglobin: 11.3 g/dL (ref 11.1–15.9)
Immature Grans (Abs): 0 10*3/uL (ref 0.0–0.1)
Immature Granulocytes: 0 %
LYMPHS ABS: 1.9 10*3/uL (ref 0.7–3.1)
Lymphs: 47 %
MCH: 28.3 pg (ref 26.6–33.0)
MCHC: 32.2 g/dL (ref 31.5–35.7)
MCV: 88 fL (ref 79–97)
MONOS ABS: 0.3 10*3/uL (ref 0.1–0.9)
Monocytes: 8 %
Neutrophils Absolute: 1.6 10*3/uL (ref 1.4–7.0)
Neutrophils: 39 %
Platelets: 243 10*3/uL (ref 150–450)
RBC: 4 x10E6/uL (ref 3.77–5.28)
RDW: 14.1 % (ref 11.7–15.4)
WBC: 4.1 10*3/uL (ref 3.4–10.8)

## 2019-02-21 LAB — LIPID PANEL
CHOL/HDL RATIO: 4.1 ratio (ref 0.0–4.4)
CHOLESTEROL TOTAL: 148 mg/dL (ref 100–199)
HDL: 36 mg/dL — ABNORMAL LOW (ref >39)
LDL Calculated: 91 mg/dL (ref 0–99)
TRIGLYCERIDES: 106 mg/dL (ref 0–149)
VLDL Cholesterol Cal: 21 mg/dL (ref 5–40)

## 2019-02-21 LAB — TSH: TSH: 3.48 u[IU]/mL (ref 0.450–4.500)

## 2019-02-21 LAB — VITAMIN B12: Vitamin B-12: 480 pg/mL (ref 232–1245)

## 2020-02-25 ENCOUNTER — Other Ambulatory Visit: Payer: Self-pay

## 2020-02-25 ENCOUNTER — Ambulatory Visit (INDEPENDENT_AMBULATORY_CARE_PROVIDER_SITE_OTHER): Payer: BC Managed Care – PPO | Admitting: Internal Medicine

## 2020-02-25 ENCOUNTER — Encounter: Payer: Self-pay | Admitting: Internal Medicine

## 2020-02-25 VITALS — BP 126/74 | HR 69 | Temp 97.1°F | Ht 62.0 in | Wt 204.0 lb

## 2020-02-25 DIAGNOSIS — Z1231 Encounter for screening mammogram for malignant neoplasm of breast: Secondary | ICD-10-CM | POA: Diagnosis not present

## 2020-02-25 DIAGNOSIS — K582 Mixed irritable bowel syndrome: Secondary | ICD-10-CM

## 2020-02-25 DIAGNOSIS — E559 Vitamin D deficiency, unspecified: Secondary | ICD-10-CM | POA: Diagnosis not present

## 2020-02-25 DIAGNOSIS — Z0000 Encounter for general adult medical examination without abnormal findings: Secondary | ICD-10-CM

## 2020-02-25 DIAGNOSIS — E538 Deficiency of other specified B group vitamins: Secondary | ICD-10-CM

## 2020-02-25 LAB — POCT URINALYSIS DIPSTICK
Bilirubin, UA: NEGATIVE
Glucose, UA: NEGATIVE
Ketones, UA: NEGATIVE
Leukocytes, UA: NEGATIVE
Nitrite, UA: NEGATIVE
Protein, UA: NEGATIVE
Spec Grav, UA: 1.01 (ref 1.010–1.025)
Urobilinogen, UA: 0.2 E.U./dL
pH, UA: 6 (ref 5.0–8.0)

## 2020-02-25 MED ORDER — CYANOCOBALAMIN 1000 MCG/ML IJ SOLN: 1000.0000 ug | INTRAMUSCULAR | 1 refills | Status: DC

## 2020-02-25 NOTE — Progress Notes (Signed)
Date:  02/25/2020   Name:  Melinda Nelson   DOB:  1976-04-07   MRN:  [id number]   Chief Complaint: Annual Exam (Breast exam - Pt started period today and unable to do papsmear. Will reschedule pap only or wait until next year.) Melinda Nelson is a 44 y.o. female who presents today for her Complete Annual Exam. She feels well. 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 She reports she is sleeping well. She denies breast issues.  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  Mammogram 2019? [facility] Pap 2016 neg with cotesting; started menses today Immunization History  Administered Date(s) Administered  . Influenza,inj,Quad PF,6+ Mos 10/26/2016, 09/07/2018  . Influenza-Unspecified 10/16/2015  . Td 03/09/2008  . Tdap 02/20/2019    HPI  B12 def - still taking self administered B12 injection 1-2 times per month.  Intermittent tingling in fingers is unchanged.  No pain or weakness noted. IBS - intermittent diarrhea and constipation that is well managed with dietary changes.  No report of bleeding, pain, weight loss.  Lab Results  Component Value Date   CREATININE 0.93 02/20/2019   BUN 9 02/20/2019   NA 140 02/20/2019   K 4.2 02/20/2019   CL 103 02/20/2019   CO2 22 02/20/2019   Lab Results  Component Value Date   CHOL 148 02/20/2019   HDL 36 (L) 02/20/2019   LDLCALC 91 02/20/2019   TRIG 106 02/20/2019   CHOLHDL 4.1 02/20/2019   Lab Results  Component Value Date   TSH 3.480 02/20/2019   No results found for: HGBA1C   Review of Systems  Constitutional: Negative for chills, fatigue and fever.  HENT: Negative for congestion, hearing loss, tinnitus, trouble swallowing and voice change.   Eyes: Negative for visual disturbance (reading glasses).  Respiratory: Negative for cough, chest tightness, shortness of breath and wheezing.   Cardiovascular: Negative for chest pain, palpitations and leg swelling.  Gastrointestinal:  Positive for constipation and diarrhea. Negative for abdominal pain and vomiting.  Endocrine: Negative for polydipsia and polyuria.  Genitourinary: Negative for dysuria, frequency, genital sores, menstrual problem, vaginal bleeding and vaginal discharge.  Musculoskeletal: Negative for arthralgias, gait problem and joint swelling.  Skin: Negative for color change and rash.  Neurological: Positive for numbness (intermittent tingling in fingers thought to be due to low B12). Negative for dizziness, tremors, light-headedness and headaches.  Hematological: Negative for adenopathy. Does not bruise/bleed easily.  Psychiatric/Behavioral: Negative for dysphoric mood and sleep disturbance. The patient is not nervous/anxious.     Patient Active Problem List   Diagnosis Date Noted  . Breast cyst, left 09/07/2018  . Vitamin D deficiency 05/20/2016  . Vitamin B12 deficiency 05/20/2016  . 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 Neuropathy 05/20/2016  . IBS (irritable bowel syndrome) 02/19/2016    Allergies  Allergen Reactions  . Lamisil [Terbinafine]     Skin reaction  . Penicillins     Rash  . Tramadol Nausea And Vomiting    Past Surgical History:  Procedure Laterality Date  . CESAREAN SECTION     x 3  . TUBAL LIGATION  2009    Social History   Tobacco Use  . Smoking status: Never Smoker  . Smokeless tobacco: Never Used  Substance Use Topics  . Alcohol use: Yes    Alcohol/week: 0.0 standard drinks    Comment: rarely  . Drug use: No     Medication list has  been reviewed and updated.  Current Meds  Medication Sig  . Ascorbic Acid (VITAMIN C) 1000 MG tablet Take 1,000 mg by mouth daily.  . cholecalciferol (VITAMIN D) 1000 units tablet Take 1,000 Units by mouth daily.  . cyanocobalamin (,VITAMIN B-12,) 1000 MCG/ML injection Inject 1 mL (1,000 mcg total) into the muscle every 14 (fourteen) days.  . Pseudoephedrine HCl (DECONGESTANT 60 PO) Take by mouth.    PHQ 2/9 Scores  02/25/2020 02/20/2019 09/07/2018  PHQ - 2 Score 0 0 0  PHQ- 9 Score 0 - -    BP Readings from Last 3 Encounters:  02/25/20 126/74  02/20/19 128/64  12/26/18 138/78    Physical Exam Vitals and nursing note reviewed.  Constitutional:      General: She is not in acute distress.    Appearance: She is well-developed.  HENT:     Head: Normocephalic and atraumatic.     Right Ear: Tympanic membrane and ear canal normal.     Left Ear: Tympanic membrane and ear canal normal.     Nose:     Right Sinus: No maxillary sinus tenderness.     Left Sinus: No maxillary sinus tenderness.  Eyes:     General: No scleral icterus.       Right eye: No discharge.        Left eye: No discharge.     Conjunctiva/sclera: Conjunctivae normal.  Neck:     Thyroid: No thyromegaly.     Vascular: No carotid bruit.  Cardiovascular:     Rate and Rhythm: Normal rate and regular rhythm.     Pulses: Normal pulses.     Heart sounds: Normal heart sounds.  Pulmonary:     Effort: Pulmonary effort is normal. No respiratory distress.     Breath sounds: No wheezing.  Chest:     Breasts:        Right: No mass, nipple discharge, skin change or tenderness.        Left: No mass, nipple discharge, skin change or tenderness.  Abdominal:     General: Bowel sounds are normal.     Palpations: Abdomen is soft.     Tenderness: There is no abdominal tenderness.  Musculoskeletal:        General: Normal range of motion.     Cervical back: Normal range of motion. No erythema.  Lymphadenopathy:     Cervical: No cervical adenopathy.  Skin:    General: Skin is warm and dry.     Findings: No rash.  Neurological:     Mental Status: She is alert and oriented to person, place, and time.     Cranial Nerves: No cranial nerve deficit.     Sensory: No sensory deficit.     Deep Tendon Reflexes: Reflexes are normal and symmetric.  Psychiatric:        Speech: Speech normal.        Behavior: Behavior normal.        Thought Content:  Thought content normal.     Wt Readings from Last 3 Encounters:  02/25/20 204 lb (92.5 kg)  02/20/19 197 lb (89.4 kg)  12/26/18 195 lb 9.6 oz (88.7 kg)    BP 126/74   Pulse 69   Temp (!) 97.1 F (36.2 C) (Temporal)   Ht 5\' 2"  (1.575 m)   Wt 204 lb (92.5 kg)   LMP 02/25/2020 (Exact Date)   SpO2 100%   BMI 37.31 kg/m   Assessment and Plan: 1. Annual physical  exam Pap delayed to next year due to menses (no prior hx of abnormal Pap) Exam otherwise normal except for weight - Lipid panel - TSH - POCT urinalysis dipstick  2. Encounter for screening mammogram for breast cancer To be scheduled at [place]; Future  3. Irritable bowel syndrome with both constipation and diarrhea Stable intermittent symptoms controlled with diet - CBC with Differential/Platelet - Comprehensive metabolic panel  4. Vitamin D deficiency Continue daily supplement - VITAMIN D 25 Hydroxy (Vit-D Deficiency, Fractures)  5. Vitamin B12 deficiency Continue bi-monthly self injections - cyanocobalamin (,VITAMIN B-12,) 1000 MCG/ML injection; Inject 1 mL (1,000 mcg total) into the muscle every 14 (fourteen) days.  Dispense: 10 mL; Refill: 1   Partially dictated using Editor, commissioning. Any errors are unintentional.  Halina Maidens, MD [facility]  02/25/2020

## 2020-02-26 LAB — VITAMIN D 25 HYDROXY (VIT D DEFICIENCY, FRACTURES): Vit D, 25-Hydroxy: 24.2 ng/mL — ABNORMAL LOW (ref 30.0–100.0)

## 2020-02-26 LAB — LIPID PANEL
Chol/HDL Ratio: 5.2 ratio — ABNORMAL HIGH (ref 0.0–4.4)
Cholesterol, Total: 161 mg/dL (ref 100–199)
HDL: 31 mg/dL — ABNORMAL LOW (ref >39)
LDL Chol Calc (NIH): 106 mg/dL — ABNORMAL HIGH (ref 0–99)
Triglycerides: 133 mg/dL (ref 0–149)
VLDL Cholesterol Cal: 24 mg/dL (ref 5–40)

## 2020-02-26 LAB — CBC WITH DIFFERENTIAL/PLATELET
Basophils Absolute: 0 10*3/uL (ref 0.0–0.2)
Basos: 1 %
EOS (ABSOLUTE): 0.3 10*3/uL (ref 0.0–0.4)
Eos: 6 %
Hematocrit: 36.8 % (ref 34.0–46.6)
Hemoglobin: 12.1 g/dL (ref 11.1–15.9)
Immature Grans (Abs): 0 10*3/uL (ref 0.0–0.1)
Immature Granulocytes: 0 %
Lymphocytes Absolute: 1.9 10*3/uL (ref 0.7–3.1)
Lymphs: 41 %
MCH: 28.6 pg (ref 26.6–33.0)
MCHC: 32.9 g/dL (ref 31.5–35.7)
MCV: 87 fL (ref 79–97)
Monocytes Absolute: 0.4 10*3/uL (ref 0.1–0.9)
Monocytes: 9 %
Neutrophils Absolute: 2.1 10*3/uL (ref 1.4–7.0)
Neutrophils: 43 %
Platelets: 263 10*3/uL (ref 150–450)
RBC: 4.23 x10E6/uL (ref 3.77–5.28)
RDW: 13.5 % (ref 11.7–15.4)
WBC: 4.7 10*3/uL (ref 3.4–10.8)

## 2020-02-26 LAB — COMPREHENSIVE METABOLIC PANEL
ALT: 16 IU/L (ref 0–32)
AST: 17 IU/L (ref 0–40)
Albumin/Globulin Ratio: 1.5 (ref 1.2–2.2)
Albumin: 4.1 g/dL (ref 3.8–4.8)
Alkaline Phosphatase: 86 IU/L (ref 39–117)
BUN/Creatinine Ratio: 12 (ref 9–23)
BUN: 11 mg/dL (ref 6–24)
Bilirubin Total: 0.3 mg/dL (ref 0.0–1.2)
CO2: 23 mmol/L (ref 20–29)
Calcium: 9 mg/dL (ref 8.7–10.2)
Chloride: 104 mmol/L (ref 96–106)
Creatinine, Ser: 0.9 mg/dL (ref 0.57–1.00)
GFR calc Af Amer: 89 mL/min/{1.73_m2} (ref >59)
GFR calc non Af Amer: 77 mL/min/{1.73_m2} (ref >59)
Globulin, Total: 2.7 g/dL (ref 1.5–4.5)
Glucose: 95 mg/dL (ref 65–99)
Potassium: 4.5 mmol/L (ref 3.5–5.2)
Sodium: 139 mmol/L (ref 134–144)
Total Protein: 6.8 g/dL (ref 6.0–8.5)

## 2020-02-26 LAB — TSH: TSH: 7.72 u[IU]/mL — ABNORMAL HIGH (ref 0.450–4.500)

## 2020-02-27 LAB — T4: T4, Total: 6.1 ug/dL (ref 4.5–12.0)

## 2020-02-27 LAB — SPECIMEN STATUS REPORT

## 2020-05-26 ENCOUNTER — Ambulatory Visit: Payer: BC Managed Care – PPO | Admitting: Internal Medicine

## 2020-05-26 ENCOUNTER — Encounter: Payer: Self-pay | Admitting: Internal Medicine

## 2020-05-26 ENCOUNTER — Other Ambulatory Visit: Payer: Self-pay

## 2020-05-26 VITALS — BP 112/78 | HR 82 | Temp 98.2°F | Ht 62.0 in | Wt 201.0 lb

## 2020-05-26 DIAGNOSIS — M62838 Other muscle spasm: Secondary | ICD-10-CM

## 2020-05-26 DIAGNOSIS — R350 Frequency of micturition: Secondary | ICD-10-CM | POA: Diagnosis not present

## 2020-05-26 LAB — POCT UA - MICROSCOPIC ONLY
Casts, Ur, LPF, POC: 0
Crystals, Ur, HPF, POC: 0
Epithelial cells, urine per micros: 2
Mucus, UA: 0
RBC, urine, microscopic: 2
WBC, Ur, HPF, POC: 10
Yeast, UA: 0

## 2020-05-26 MED ORDER — FLUCONAZOLE 100 MG PO TABS
100.0000 mg | ORAL_TABLET | Freq: Every day | ORAL | 0 refills | Status: AC
Start: 2020-05-26 — End: 2020-05-29

## 2020-05-26 MED ORDER — CYCLOBENZAPRINE HCL 10 MG PO TABS: 10.0000 mg | ORAL_TABLET | Freq: Three times a day (TID) | ORAL | 0 refills | Status: DC | PRN

## 2020-05-26 MED ORDER — NITROFURANTOIN MONOHYD MACRO 100 MG PO CAPS: 100.0000 mg | ORAL_CAPSULE | Freq: Two times a day (BID) | ORAL | 0 refills | Status: AC

## 2020-05-26 NOTE — Progress Notes (Signed)
Date:  05/26/2020   Name:  Melinda Nelson   DOB:  04-24-1976   MRN:  [id number]   Chief Complaint: Urinary Tract Infection (Taking AZO. Having frequency, pain, bladder fullness. Started yesterday right before lunch. ) and Neck Pain (Stiff neck since yesterday- muscle spasms.)  Urinary Tract Infection  This is a new problem. The current episode started yesterday. The problem has been unchanged. The quality of the pain is described as aching and burning. The pain is moderate. There has been no fever. Associated symptoms include frequency, nausea and urgency. Pertinent negatives include no chills, discharge, flank pain or hematuria. She has tried increased fluids (AZO) for the symptoms. The treatment provided mild relief.  Neck Pain  This is a new problem. The current episode started yesterday. The problem has been unchanged. The pain is associated with nothing. The pain is present in the midline. The quality of the pain is described as aching. The pain is moderate. The symptoms are aggravated by twisting. Pertinent negatives include no chest pain, fever or headaches. She has tried heat and home exercises for the symptoms. The treatment provided mild relief.    Lab Results  Component Value Date   CREATININE 0.90 02/25/2020   BUN 11 02/25/2020   NA 139 02/25/2020   K 4.5 02/25/2020   CL 104 02/25/2020   CO2 23 02/25/2020   Lab Results  Component Value Date   CHOL 161 02/25/2020   HDL 31 (L) 02/25/2020   LDLCALC 106 (H) 02/25/2020   TRIG 133 02/25/2020   CHOLHDL 5.2 (H) 02/25/2020   Lab Results  Component Value Date   TSH 7.720 (H) 02/25/2020   No results found for: HGBA1C Lab Results  Component Value Date   WBC 4.7 02/25/2020   HGB 12.1 02/25/2020   HCT 36.8 02/25/2020   MCV 87 02/25/2020   PLT 263 02/25/2020   Lab Results  Component Value Date   ALT 16 02/25/2020   AST 17 02/25/2020   ALKPHOS 86 02/25/2020   BILITOT 0.3 02/25/2020     Review of Systems    Constitutional: Negative for chills, fatigue and fever.  Respiratory: Negative for chest tightness and shortness of breath.   Cardiovascular: Negative for chest pain.  Gastrointestinal: Positive for abdominal pain and nausea. Negative for constipation.  Genitourinary: Positive for frequency and urgency. Negative for flank pain and hematuria.  Musculoskeletal: Positive for myalgias and neck pain.  Neurological: Negative for dizziness and headaches.    Patient Active Problem List   Diagnosis Date Noted  . Breast cyst, left 09/07/2018  . Vitamin D deficiency 05/20/2016  . Vitamin B12 deficiency 05/20/2016  . 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 Neuropathy 05/20/2016  . IBS (irritable bowel syndrome) 02/19/2016    Allergies  Allergen Reactions  . Lamisil [Terbinafine]     Skin reaction  . Penicillins     Rash  . Tramadol Nausea And Vomiting    Past Surgical History:  Procedure Laterality Date  . CESAREAN SECTION     x 3  . TUBAL LIGATION  2009    Social History   Tobacco Use  . Smoking status: Never Smoker  . Smokeless tobacco: Never Used  Vaping Use  . Vaping Use: Never used  Substance Use Topics  . Alcohol use: Yes    Alcohol/week: 0.0 standard drinks    Comment: rarely  . Drug use: No     Medication list has been reviewed and updated.  Current Meds  Medication  Sig  . Ascorbic Acid (VITAMIN C) 1000 MG tablet Take 1,000 mg by mouth daily.  . cholecalciferol (VITAMIN D) 1000 units tablet Take 1,000 Units by mouth daily.  . cyanocobalamin (,VITAMIN B-12,) 1000 MCG/ML injection Inject 1 mL (1,000 mcg total) into the muscle every 14 (fourteen) days.  . Pseudoephedrine HCl (DECONGESTANT 60 PO) Take by mouth.    PHQ 2/9 Scores 05/26/2020 02/25/2020 02/20/2019 09/07/2018  PHQ - 2 Score 0 0 0 0  PHQ- 9 Score 0 0 - -    GAD 7 : Generalized Anxiety Score 05/26/2020  Nervous, Anxious, on Edge 0  Control/stop worrying 0  Worry too much - different things 0  Trouble  relaxing 0  Restless 0  Easily annoyed or irritable 0  Afraid - awful might happen 0  Total GAD 7 Score 0  Anxiety Difficulty Not difficult at all    BP Readings from Last 3 Encounters:  05/26/20 112/78  02/25/20 126/74  02/20/19 128/64    Physical Exam Vitals and nursing note reviewed.  Constitutional:      General: She is not in acute distress.    Appearance: She is well-developed.  HENT:     Head: Normocephalic and atraumatic.  Cardiovascular:     Rate and Rhythm: Normal rate and regular rhythm.     Heart sounds: Normal heart sounds.  Pulmonary:     Effort: Pulmonary effort is normal. No respiratory distress.     Breath sounds: Normal breath sounds.  Abdominal:     General: Bowel sounds are normal.     Palpations: Abdomen is soft.     Tenderness: There is abdominal tenderness in the suprapubic area. There is no guarding or rebound.  Musculoskeletal:     Cervical back: Spasms present. Decreased range of motion.  Skin:    General: Skin is warm and dry.     Findings: No rash.  Neurological:     Mental Status: She is alert and oriented to person, place, and time.  Psychiatric:        Behavior: Behavior normal.        Thought Content: Thought content normal.     Wt Readings from Last 3 Encounters:  05/26/20 201 lb (91.2 kg)  02/25/20 204 lb (92.5 kg)  02/20/19 197 lb (89.4 kg)    BP 112/78 (BP Location: Right Arm, Patient Position: Sitting, Cuff Size: Normal)   Pulse 82   Temp 98.2 F (36.8 C) (Oral)   Ht 5\' 2"  (1.575 m)   Wt 201 lb (91.2 kg)   SpO2 99%   BMI 36.76 kg/m   Assessment and Plan: 1. Frequency of urination Continue AZO if needed Push fluids - POCT UA - Microscopic Only - nitrofurantoin, macrocrystal-monohydrate, (MACROBID) 100 MG capsule; Take 1 capsule (100 mg total) by mouth 2 (two) times daily for 7 days.  Dispense: 14 capsule; Refill: 0  2. Neck muscle spasm Continue heat and Advil Use flexeril prn - cyclobenzaprine (FLEXERIL) 10 MG  tablet; Take 1 tablet (10 mg total) by mouth 3 (three) times daily as needed for muscle spasms.  Dispense: 30 tablet; Refill: 0   Partially dictated using Editor, commissioning. Any errors are unintentional.  Halina Maidens, MD [facility]  05/26/2020

## 2020-06-26 ENCOUNTER — Ambulatory Visit: Payer: BC Managed Care – PPO | Admitting: Internal Medicine

## 2020-06-26 ENCOUNTER — Other Ambulatory Visit: Payer: Self-pay

## 2020-06-26 ENCOUNTER — Encounter: Payer: Self-pay | Admitting: Internal Medicine

## 2020-06-26 VITALS — BP 130/82 | HR 75 | Temp 98.4°F | Ht 62.0 in | Wt 196.0 lb

## 2020-06-26 DIAGNOSIS — N3000 Acute cystitis without hematuria: Secondary | ICD-10-CM | POA: Diagnosis not present

## 2020-06-26 LAB — POCT UA - MICROSCOPIC ONLY
Casts, Ur, LPF, POC: 0
Crystals, Ur, HPF, POC: 0
Epithelial cells, urine per micros: 2
Mucus, UA: 0
RBC, urine, microscopic: 0
WBC, Ur, HPF, POC: 25
Yeast, UA: 0

## 2020-06-26 MED ORDER — NITROFURANTOIN MONOHYD MACRO 100 MG PO CAPS: 100.0000 mg | ORAL_CAPSULE | Freq: Two times a day (BID) | ORAL | 0 refills | Status: DC

## 2020-06-26 NOTE — Progress Notes (Signed)
Date:  06/26/2020   Name:  Keyanah Kozicki   DOB:  1976-01-13   MRN:  [id number]   Chief Complaint: Urinary Tract Infection (x1-2 days,has taken azo, tingle, freq. urinating, cramping, fullness in bladder nausea  )  Urinary Tract Infection  This is a new problem. The current episode started yesterday. The problem has been unchanged. The quality of the pain is described as burning. The pain is mild. There has been no fever. Associated symptoms include frequency, nausea and urgency. Pertinent negatives include no chills. Treatments tried: AZO and fluids.    Lab Results  Component Value Date   CREATININE 0.90 02/25/2020   BUN 11 02/25/2020   NA 139 02/25/2020   K 4.5 02/25/2020   CL 104 02/25/2020   CO2 23 02/25/2020   Lab Results  Component Value Date   CHOL 161 02/25/2020   HDL 31 (L) 02/25/2020   LDLCALC 106 (H) 02/25/2020   TRIG 133 02/25/2020   CHOLHDL 5.2 (H) 02/25/2020   Lab Results  Component Value Date   TSH 7.720 (H) 02/25/2020   No results found for: HGBA1C Lab Results  Component Value Date   WBC 4.7 02/25/2020   HGB 12.1 02/25/2020   HCT 36.8 02/25/2020   MCV 87 02/25/2020   PLT 263 02/25/2020   Lab Results  Component Value Date   ALT 16 02/25/2020   AST 17 02/25/2020   ALKPHOS 86 02/25/2020   BILITOT 0.3 02/25/2020     Review of Systems  Constitutional: Negative for chills, fatigue and fever.  Respiratory: Negative for chest tightness.   Cardiovascular: Negative for chest pain.  Gastrointestinal: Positive for nausea.  Genitourinary: Positive for frequency and urgency.    Patient Active Problem List   Diagnosis Date Noted  . Breast cyst, left 09/07/2018  . Vitamin D deficiency 05/20/2016  . Vitamin B12 deficiency 05/20/2016  . 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 Neuropathy 05/20/2016  . IBS (irritable bowel syndrome) 02/19/2016    Allergies  Allergen Reactions  . Lamisil [Terbinafine]     Skin reaction  . Penicillins     Rash  .  Tramadol Nausea And Vomiting    Past Surgical History:  Procedure Laterality Date  . CESAREAN SECTION     x 3  . TUBAL LIGATION  2009    Social History   Tobacco Use  . Smoking status: Never Smoker  . Smokeless tobacco: Never Used  Vaping Use  . Vaping Use: Never used  Substance Use Topics  . Alcohol use: Yes    Alcohol/week: 0.0 standard drinks    Comment: rarely  . Drug use: No     Medication list has been reviewed and updated.  Current Meds  Medication Sig  . Ascorbic Acid (VITAMIN C) 1000 MG tablet Take 1,000 mg by mouth daily.  . cholecalciferol (VITAMIN D) 1000 units tablet Take 1,000 Units by mouth daily.  . cyanocobalamin (,VITAMIN B-12,) 1000 MCG/ML injection Inject 1 mL (1,000 mcg total) into the muscle every 14 (fourteen) days.  . cyclobenzaprine (FLEXERIL) 10 MG tablet Take 1 tablet (10 mg total) by mouth 3 (three) times daily as needed for muscle spasms.  . Pseudoephedrine HCl (DECONGESTANT 60 PO) Take by mouth as needed.     PHQ 2/9 Scores 06/26/2020 05/26/2020 02/25/2020 02/20/2019  PHQ - 2 Score 0 0 0 0  PHQ- 9 Score 0 0 0 -    GAD 7 : Generalized Anxiety Score 06/26/2020 05/26/2020  Nervous, Anxious, on Edge  0 0  Control/stop worrying 0 0  Worry too much - different things 0 0  Trouble relaxing 0 0  Restless 0 0  Easily annoyed or irritable 0 0  Afraid - awful might happen 0 0  Total GAD 7 Score 0 0  Anxiety Difficulty Not difficult at all Not difficult at all    BP Readings from Last 3 Encounters:  06/26/20 130/82  05/26/20 112/78  02/25/20 126/74    Physical Exam Vitals and nursing note reviewed.  Constitutional:      General: She is not in acute distress.    Appearance: She is well-developed.  Cardiovascular:     Rate and Rhythm: Normal rate and regular rhythm.     Heart sounds: Normal heart sounds.  Pulmonary:     Effort: Pulmonary effort is normal. No respiratory distress.     Breath sounds: Normal breath sounds.  Abdominal:      General: Bowel sounds are normal.     Palpations: Abdomen is soft.     Tenderness: There is abdominal tenderness in the suprapubic area. There is no guarding or rebound.  Neurological:     Mental Status: She is alert.     Wt Readings from Last 3 Encounters:  06/26/20 196 lb (88.9 kg)  05/26/20 201 lb (91.2 kg)  02/25/20 204 lb (92.5 kg)    BP 130/82   Pulse 75   Temp 98.4 F (36.9 C) (Oral)   Ht 5\' 2"  (1.575 m)   Wt 196 lb (88.9 kg)   SpO2 98%   BMI 35.85 kg/m   Assessment and Plan: 1. Acute cystitis without hematuria Continue AZO as needed Continue to push fluids - POCT UA - Microscopic Only - nitrofurantoin, macrocrystal-monohydrate, (MACROBID) 100 MG capsule; Take 1 capsule (100 mg total) by mouth 2 (two) times daily for 14 days.  Dispense: 28 capsule; Refill: 0   Partially dictated using . Any errors are unintentional.  Animal nutritionist, MD [facility]  06/26/2020

## 2020-08-10 ENCOUNTER — Other Ambulatory Visit: Payer: Self-pay

## 2020-08-10 ENCOUNTER — Ambulatory Visit: Payer: BC Managed Care – PPO | Admitting: Internal Medicine

## 2020-08-10 ENCOUNTER — Encounter: Payer: Self-pay | Admitting: Internal Medicine

## 2020-08-10 VITALS — BP 130/84 | HR 65 | Temp 98.4°F | Ht 62.0 in | Wt 193.0 lb

## 2020-08-10 DIAGNOSIS — M778 Other enthesopathies, not elsewhere classified: Secondary | ICD-10-CM | POA: Diagnosis not present

## 2020-08-10 NOTE — Progress Notes (Signed)
Date:  08/10/2020   Name:  Melinda Nelson   DOB:  February 07, 1976   MRN:  [id number]   Chief Complaint: Wrist Pain (x2-3 weeks ago left wrist, sore and stiff, painful, hasnt got better, hit wrist this weekend(yesterday) wrist sounded like it cracked, swollen)  Wrist Pain  The pain is present in the left wrist. This is a new problem. The current episode started 1 to 4 weeks ago. The problem occurs constantly. The problem has been gradually worsening. The pain is moderate. Associated symptoms include an inability to bear weight and a limited range of motion. Pertinent negatives include no fever, numbness or stiffness. She has tried NSAIDS, rest and cold for the symptoms. The treatment provided moderate relief.    Lab Results  Component Value Date   CREATININE 0.90 02/25/2020   BUN 11 02/25/2020   NA 139 02/25/2020   K 4.5 02/25/2020   CL 104 02/25/2020   CO2 23 02/25/2020   Lab Results  Component Value Date   CHOL 161 02/25/2020   HDL 31 (L) 02/25/2020   LDLCALC 106 (H) 02/25/2020   TRIG 133 02/25/2020   CHOLHDL 5.2 (H) 02/25/2020   Lab Results  Component Value Date   TSH 7.720 (H) 02/25/2020   No results found for: HGBA1C Lab Results  Component Value Date   WBC 4.7 02/25/2020   HGB 12.1 02/25/2020   HCT 36.8 02/25/2020   MCV 87 02/25/2020   PLT 263 02/25/2020   Lab Results  Component Value Date   ALT 16 02/25/2020   AST 17 02/25/2020   ALKPHOS 86 02/25/2020   BILITOT 0.3 02/25/2020     Review of Systems  Constitutional: Negative for fever.  Musculoskeletal: Positive for arthralgias and joint swelling. Negative for stiffness.  Neurological: Negative for weakness and numbness.  Psychiatric/Behavioral: Positive for sleep disturbance.    Patient Active Problem List   Diagnosis Date Noted  . Breast cyst, left 09/07/2018  . Vitamin D deficiency 05/20/2016  . Vitamin B12 deficiency 05/20/2016  . 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 Neuropathy 05/20/2016  . IBS  (irritable bowel syndrome) 02/19/2016    Allergies  Allergen Reactions  . Lamisil [Terbinafine]     Skin reaction  . Penicillins     Rash  . Tramadol Nausea And Vomiting    Past Surgical History:  Procedure Laterality Date  . CESAREAN SECTION     x 3  . TUBAL LIGATION  2009    Social History   Tobacco Use  . Smoking status: Never Smoker  . Smokeless tobacco: Never Used  Vaping Use  . Vaping Use: Never used  Substance Use Topics  . Alcohol use: Yes    Alcohol/week: 0.0 standard drinks    Comment: rarely  . Drug use: No     Medication list has been reviewed and updated.  Current Meds  Medication Sig  . Ascorbic Acid (VITAMIN C) 1000 MG tablet Take 1,000 mg by mouth daily.  . cholecalciferol (VITAMIN D) 1000 units tablet Take 1,000 Units by mouth daily.  . cyanocobalamin (,VITAMIN B-12,) 1000 MCG/ML injection Inject 1 mL (1,000 mcg total) into the muscle every 14 (fourteen) days.  . cyclobenzaprine (FLEXERIL) 10 MG tablet Take 1 tablet (10 mg total) by mouth 3 (three) times daily as needed for muscle spasms.  . [DISCONTINUED] Pseudoephedrine HCl (DECONGESTANT 60 PO) Take by mouth as needed.     PHQ 2/9 Scores 06/26/2020 05/26/2020 02/25/2020 02/20/2019  PHQ - 2 Score 0 0  0 0  PHQ- 9 Score 0 0 0 -    GAD 7 : Generalized Anxiety Score 06/26/2020 05/26/2020  Nervous, Anxious, on Edge 0 0  Control/stop worrying 0 0  Worry too much - different things 0 0  Trouble relaxing 0 0  Restless 0 0  Easily annoyed or irritable 0 0  Afraid - awful might happen 0 0  Total GAD 7 Score 0 0  Anxiety Difficulty Not difficult at all Not difficult at all    BP Readings from Last 3 Encounters:  08/10/20 130/84  06/26/20 130/82  05/26/20 112/78    Physical Exam Vitals and nursing note reviewed.  Constitutional:      General: She is not in acute distress.    Appearance: She is well-developed.  HENT:     Head: Normocephalic and atraumatic.  Pulmonary:     Effort: Pulmonary  effort is normal. No respiratory distress.  Musculoskeletal:     Right wrist: Normal.     Left wrist: Swelling and snuff box tenderness present. No deformity or effusion. Decreased range of motion. Normal pulse.  Skin:    General: Skin is warm and dry.     Findings: No rash.  Neurological:     Mental Status: She is alert and oriented to person, place, and time.  Psychiatric:        Behavior: Behavior normal.        Thought Content: Thought content normal.     Wt Readings from Last 3 Encounters:  08/10/20 193 lb (87.5 kg)  06/26/20 196 lb (88.9 kg)  05/26/20 201 lb (91.2 kg)    BP 130/84   Pulse 65   Temp 98.4 F (36.9 C) (Oral)   Ht 5\' 2"  (1.575 m)   Wt 193 lb (87.5 kg)   SpO2 99%   BMI 35.30 kg/m   Assessment and Plan: 1. Tendonitis of wrist, left Continue Advil 600 mg tid Continue Ice and rest If no improvement in the next week, can consider steroid taper vs Ortho referral   Partially dictated using . Any errors are unintentional.  Animal nutritionist, MD [facility]  08/10/2020

## 2020-08-10 NOTE — Patient Instructions (Signed)
De Quervain's Tenosynovitis  De Quervain's tenosynovitis is a condition that causes inflammation of the tendon on the thumb side of the wrist. Tendons are cords of tissue that connect bones to muscles. The tendons in the hand pass through a tunnel called a sheath. A slippery layer of tissue (synovium) lets the tendons move smoothly in the sheath. With de Quervain's tenosynovitis, the sheath swells or thickens, causing friction and pain. The condition is also called de Quervain's disease and de Quervain's syndrome. It occurs most often in women who are 30-50 years old. What are the causes? The exact cause of this condition is not known. It may be associated with overuse of the hand and wrist. What increases the risk? You are more likely to develop this condition if you:  Use your hands far more than normal, especially if you repeat certain movements that involve twisting your hand or using a tight grip.  Are pregnant.  Are a middle-aged woman.  Have rheumatoid arthritis.  Have diabetes. What are the signs or symptoms? The main symptom of this condition is pain on the thumb side of the wrist. The pain may get worse when you grasp something or turn your wrist. Other symptoms may include:  Pain that extends up the forearm.  Swelling of your wrist and hand.  Trouble moving the thumb and wrist.  A sensation of snapping in the wrist.  A bump filled with fluid (cyst) in the area of the pain. How is this diagnosed? This condition may be diagnosed based on:  Your symptoms and medical history.  A physical exam. During the exam, your health care provider may do a simple test (Finkelstein test) that involves pulling your thumb and wrist to see if this causes pain. You may also need to have an X-ray. How is this treated? Treatment for this condition may include:  Avoiding any activity that causes pain and swelling.  Taking medicines. Anti-inflammatory medicines and corticosteroid  injections may be used to reduce inflammation and relieve pain.  Wearing a splint.  Having surgery. This may be needed if other treatments do not work. Once the pain and swelling has gone down:  Physical therapy. This includes stretching and strengthening exercises.  Occupational therapy. This includes adjusting how you move your wrist. Follow these instructions at home: If you have a splint:  Wear the splint as told by your health care provider. Remove it only as told by your health care provider.  Loosen the splint if your fingers tingle, become numb, or turn cold and blue.  Keep the splint clean.  If the splint is not waterproof: ? Do not let it get wet. ? Cover it with a watertight covering when you take a bath or a shower. Managing pain, stiffness, and swelling   Avoid movements and activities that cause pain and swelling in the wrist area.  If directed, put ice on the painful area. This may be helpful after doing activities that involve the sore wrist. ? Put ice in a plastic bag. ? Place a towel between your skin and the bag. ? Leave the ice on for 20 minutes, 2-3 times a day.  Move your fingers often to avoid stiffness and to lessen swelling.  Raise (elevate) the injured area above the level of your heart while you are sitting or lying down. General instructions  Return to your normal activities as told by your health care provider. Ask your health care provider what activities are safe for you.  Take over-the-counter   and prescription medicines only as told by your health care provider.  Keep all follow-up visits as told by your health care provider. This is important. Contact a health care provider if:  Your pain medicine does not help.  Your pain gets worse.  You develop new symptoms. Summary  De Quervain's tenosynovitis is a condition that causes inflammation of the tendon on the thumb side of the wrist.  The condition occurs most often in women who are  30-50 years old.  The exact cause of this condition is not known. It may be associated with overuse of the hand and wrist.  Treatment starts with avoiding activity that causes pain or swelling in the wrist area. Other treatment may include wearing a splint and taking medicine. Sometimes, surgery is needed. This information is not intended to replace advice given to you by your health care provider. Make sure you discuss any questions you have with your health care provider. Document Revised: 05/31/2018 Document Reviewed: 11/06/2017 Elsevier Patient Education  2020 Elsevier Inc.  

## 2020-09-25 ENCOUNTER — Encounter: Payer: Self-pay | Admitting: Internal Medicine

## 2020-09-27 ENCOUNTER — Other Ambulatory Visit: Payer: Self-pay | Admitting: Internal Medicine

## 2020-09-27 DIAGNOSIS — N3000 Acute cystitis without hematuria: Secondary | ICD-10-CM

## 2020-09-27 DIAGNOSIS — N3090 Cystitis, unspecified without hematuria: Secondary | ICD-10-CM

## 2020-09-27 MED ORDER — NITROFURANTOIN MONOHYD MACRO 100 MG PO CAPS: ORAL_CAPSULE | ORAL | 2 refills | Status: DC

## 2020-11-26 ENCOUNTER — Encounter: Payer: Self-pay | Admitting: Internal Medicine

## 2021-02-26 ENCOUNTER — Ambulatory Visit (INDEPENDENT_AMBULATORY_CARE_PROVIDER_SITE_OTHER): Payer: BC Managed Care – PPO | Admitting: Internal Medicine

## 2021-02-26 ENCOUNTER — Other Ambulatory Visit (HOSPITAL_COMMUNITY)
Admission: RE | Admit: 2021-02-26 | Discharge: 2021-02-26 | Disposition: A | Discharge status: Home / Self Care | Payer: BC Managed Care – PPO | Source: Ambulatory Visit | Attending: Internal Medicine | Admitting: Internal Medicine

## 2021-02-26 ENCOUNTER — Encounter: Payer: Self-pay | Admitting: Internal Medicine

## 2021-02-26 ENCOUNTER — Other Ambulatory Visit: Payer: Self-pay

## 2021-02-26 VITALS — BP 136/86 | HR 70 | Temp 98.2°F | Ht 62.0 in | Wt 201.0 lb

## 2021-02-26 DIAGNOSIS — Z1231 Encounter for screening mammogram for malignant neoplasm of breast: Secondary | ICD-10-CM | POA: Diagnosis not present

## 2021-02-26 DIAGNOSIS — Z124 Encounter for screening for malignant neoplasm of cervix: Secondary | ICD-10-CM

## 2021-02-26 DIAGNOSIS — E559 Vitamin D deficiency, unspecified: Secondary | ICD-10-CM

## 2021-02-26 DIAGNOSIS — E538 Deficiency of other specified B group vitamins: Secondary | ICD-10-CM

## 2021-02-26 DIAGNOSIS — Z0000 Encounter for general adult medical examination without abnormal findings: Secondary | ICD-10-CM | POA: Diagnosis not present

## 2021-02-26 MED ORDER — CYANOCOBALAMIN 1000 MCG/ML IJ SOLN: 1000.0000 ug | INTRAMUSCULAR | 1 refills | Status: AC

## 2021-02-26 NOTE — Progress Notes (Signed)
Date:  02/26/2021   Name:  Melinda Nelson   DOB:  October 01, 1976   MRN:  [id number]   Chief Complaint: Annual Exam (Breast exam no pap)  Melinda Nelson is a 45 y.o. female who presents today for her Complete Annual Exam. She feels well. 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 She reports she is sleeping well. Breast complaints none.  Mammogram: 11/2016 DEXA: none Pap smear: 04/2015 Colonoscopy: none  Immunization History  Administered Date(s) Administered  . Influenza,inj,Quad PF,6+ Mos 10/26/2016, 09/07/2018  . Influenza-Unspecified 10/16/2015  . Janssen (J&J) SARS-COV-2 Vaccination 03/23/2020  . Td 03/09/2008  . Tdap 02/20/2019    HPI  Lab Results  Component Value Date   CREATININE 0.90 02/25/2020   BUN 11 02/25/2020   NA 139 02/25/2020   K 4.5 02/25/2020   CL 104 02/25/2020   CO2 23 02/25/2020   Lab Results  Component Value Date   CHOL 161 02/25/2020   HDL 31 (L) 02/25/2020   LDLCALC 106 (H) 02/25/2020   TRIG 133 02/25/2020   CHOLHDL 5.2 (H) 02/25/2020   Lab Results  Component Value Date   TSH 7.720 (H) 02/25/2020   No results found for: HGBA1C Lab Results  Component Value Date   WBC 4.7 02/25/2020   HGB 12.1 02/25/2020   HCT 36.8 02/25/2020   MCV 87 02/25/2020   PLT 263 02/25/2020   Lab Results  Component Value Date   ALT 16 02/25/2020   AST 17 02/25/2020   ALKPHOS 86 02/25/2020   BILITOT 0.3 02/25/2020   Last vitamin D Lab Results  Component Value Date   VD25OH 24.2 (L) 02/25/2020      Review of Systems  Constitutional: Negative for chills, fatigue and fever.       Mild night sweats  HENT: Negative for congestion, hearing loss, tinnitus, trouble swallowing and voice change.   Eyes: Negative for visual disturbance.  Respiratory: Negative for cough, chest tightness, shortness of breath and wheezing.   Cardiovascular: Negative for chest pain, palpitations and leg swelling.  Gastrointestinal: Negative for abdominal pain, constipation, diarrhea and  vomiting.  Endocrine: Negative for polydipsia and polyuria.  Genitourinary: Negative for dysuria, frequency, genital sores, menstrual problem, vaginal bleeding and vaginal discharge.  Musculoskeletal: Negative for arthralgias, gait problem and joint swelling.  Skin: Negative for color change and rash.  Neurological: Negative for dizziness, tremors, light-headedness and headaches.  Hematological: Negative for adenopathy. Does not bruise/bleed easily.  Psychiatric/Behavioral: Negative for dysphoric mood and sleep disturbance. The patient is not nervous/anxious.     Patient Active Problem List   Diagnosis Date Noted  . Breast cyst, left 09/07/2018  . Vitamin D deficiency 05/20/2016  . Vitamin B12 deficiency 05/20/2016  . 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 Neuropathy 05/20/2016  . IBS (irritable bowel syndrome) 02/19/2016    Allergies  Allergen Reactions  . Lamisil [Terbinafine]     Skin reaction  . Penicillins     Rash  . Tramadol Nausea And Vomiting    Past Surgical History:  Procedure Laterality Date  . CESAREAN SECTION     x 3  . TUBAL LIGATION  2009    Social History   Tobacco Use  . Smoking status: Never Smoker  . Smokeless tobacco: Never Used  Vaping Use  . Vaping Use: Never used  Substance Use Topics  . Alcohol use: Yes    Alcohol/week: 0.0 standard drinks    Comment: rarely  . Drug use: No     Medication  list has been reviewed and updated.  Current Meds  Medication Sig  . Ascorbic Acid (VITAMIN C) 1000 MG tablet Take 1,000 mg by mouth daily.  . cholecalciferol (VITAMIN D) 1000 units tablet Take 1,000 Units by mouth daily.  . cyanocobalamin (,VITAMIN B-12,) 1000 MCG/ML injection Inject 1 mL (1,000 mcg total) into the muscle every 14 (fourteen) days. (Patient taking differently: Inject 1,000 mcg into the muscle as needed.)  . cyclobenzaprine (FLEXERIL) 10 MG tablet Take 1 tablet (10 mg total) by mouth 3 (three) times daily as needed for muscle spasms.  (Patient taking differently: Take 10 mg by mouth as needed for muscle spasms.)  . nitrofurantoin, macrocrystal-monohydrate, (MACROBID) 100 MG capsule 0ne capsule daily as needed (Patient taking differently: as needed. 0ne capsule daily as needed)    PHQ 2/9 Scores 02/26/2021 06/26/2020 05/26/2020 02/25/2020  PHQ - 2 Score 0 0 0 0  PHQ- 9 Score 0 0 0 0    GAD 7 : Generalized Anxiety Score 02/26/2021 06/26/2020 05/26/2020  Nervous, Anxious, on Edge 0 0 0  Control/stop worrying 0 0 0  Worry too much - different things 0 0 0  Trouble relaxing 0 0 0  Restless 0 0 0  Easily annoyed or irritable 0 0 0  Afraid - awful might happen 0 0 0  Total GAD 7 Score 0 0 0  Anxiety Difficulty - Not difficult at all Not difficult at all    BP Readings from Last 3 Encounters:  02/26/21 136/86  08/10/20 130/84  06/26/20 130/82    Physical Exam Vitals and nursing note reviewed.  Constitutional:      General: She is not in acute distress.    Appearance: She is well-developed.  HENT:     Head: Normocephalic and atraumatic.     Right Ear: Tympanic membrane and ear canal normal.     Left Ear: Tympanic membrane and ear canal normal.     Nose:     Right Sinus: No maxillary sinus tenderness.     Left Sinus: No maxillary sinus tenderness.  Eyes:     General: No scleral icterus.       Right eye: No discharge.        Left eye: No discharge.     Conjunctiva/sclera: Conjunctivae normal.  Neck:     Thyroid: No thyromegaly.     Vascular: No carotid bruit.  Cardiovascular:     Rate and Rhythm: Normal rate and regular rhythm.     Pulses: Normal pulses.     Heart sounds: Normal heart sounds.  Pulmonary:     Effort: Pulmonary effort is normal. No respiratory distress.     Breath sounds: No wheezing.  Chest:  Breasts:     Right: No mass, nipple discharge, skin change or tenderness.     Left: No mass, nipple discharge, skin change or tenderness.    Abdominal:     General: Bowel sounds are normal.      Palpations: Abdomen is soft.     Tenderness: There is no abdominal tenderness.  Genitourinary:    Labia:        Right: No tenderness, lesion or injury.        Left: No tenderness, lesion or injury.      Vagina: Normal.     Cervix: Normal.     Uterus: Normal.      Adnexa: Right adnexa normal and left adnexa normal.  Musculoskeletal:        General: Normal range of motion.  Cervical back: Normal range of motion. No erythema.     Right lower leg: No edema.     Left lower leg: No edema.  Lymphadenopathy:     Cervical: No cervical adenopathy.  Skin:    General: Skin is warm and dry.     Capillary Refill: Capillary refill takes less than 2 seconds.     Findings: No rash.  Neurological:     General: No focal deficit present.     Mental Status: She is alert and oriented to person, place, and time.     Cranial Nerves: No cranial nerve deficit.     Sensory: No sensory deficit.     Deep Tendon Reflexes: Reflexes are normal and symmetric.  Psychiatric:        Attention and Perception: Attention normal.        Mood and Affect: Mood normal.        Behavior: Behavior normal.     Wt Readings from Last 3 Encounters:  02/26/21 201 lb (91.2 kg)  08/10/20 193 lb (87.5 kg)  06/26/20 196 lb (88.9 kg)    BP 136/86   Pulse 70   Temp 98.2 F (36.8 C) (Oral)   Ht 5\' 2"  (1.575 m)   Wt 201 lb (91.2 kg)   SpO2 98%   BMI 36.76 kg/m   Assessment and Plan: 1. Annual physical exam Exam is normal except for weight. Encourage regular exercise and appropriate dietary changes. Slight increase in BP - limit carbs, work on aerobic exercise and weight loss. Consider colonoscopy  - CBC with Differential/Platelet - Comprehensive metabolic panel - Lipid panel - TSH  2. Encounter for screening mammogram for breast cancer Schedule at [facility] - MM 3D SCREEN BREAST BILATERAL; Future  3. Encounter for screening for cervical cancer - Cytology - PAP  4. Vitamin D deficiency Continue  supplementation - VITAMIN D 25 Hydroxy (Vit-D Deficiency, Fractures)  5. Vitamin B12 deficiency Continue IM injections monthly - Vitamin B12 - cyanocobalamin (,VITAMIN B-12,) 1000 MCG/ML injection; Inject 1 mL (1,000 mcg total) into the muscle every 14 (fourteen) days.  Dispense: 10 mL; Refill: 1   Partially dictated using [facility]. Any errors are unintentional.  Animal nutritionist, MD [facility]  02/26/2021

## 2021-02-27 LAB — CBC WITH DIFFERENTIAL/PLATELET
Basophils Absolute: 0 10*3/uL (ref 0.0–0.2)
Basos: 1 %
EOS (ABSOLUTE): 0.2 10*3/uL (ref 0.0–0.4)
Eos: 4 %
Hematocrit: 36.4 % (ref 34.0–46.6)
Hemoglobin: 12.2 g/dL (ref 11.1–15.9)
Immature Grans (Abs): 0 10*3/uL (ref 0.0–0.1)
Immature Granulocytes: 0 %
Lymphocytes Absolute: 1.9 10*3/uL (ref 0.7–3.1)
Lymphs: 42 %
MCH: 29.3 pg (ref 26.6–33.0)
MCHC: 33.5 g/dL (ref 31.5–35.7)
MCV: 87 fL (ref 79–97)
Monocytes Absolute: 0.5 10*3/uL (ref 0.1–0.9)
Monocytes: 10 %
Neutrophils Absolute: 2 10*3/uL (ref 1.4–7.0)
Neutrophils: 43 %
Platelets: 238 10*3/uL (ref 150–450)
RBC: 4.17 x10E6/uL (ref 3.77–5.28)
RDW: 13.5 % (ref 11.7–15.4)
WBC: 4.7 10*3/uL (ref 3.4–10.8)

## 2021-02-27 LAB — LIPID PANEL
Chol/HDL Ratio: 4.6 ratio — ABNORMAL HIGH (ref 0.0–4.4)
Cholesterol, Total: 193 mg/dL (ref 100–199)
HDL: 42 mg/dL (ref >39)
LDL Chol Calc (NIH): 125 mg/dL — ABNORMAL HIGH (ref 0–99)
Triglycerides: 148 mg/dL (ref 0–149)
VLDL Cholesterol Cal: 26 mg/dL (ref 5–40)

## 2021-02-27 LAB — TSH: TSH: 4.87 u[IU]/mL — ABNORMAL HIGH (ref 0.450–4.500)

## 2021-02-27 LAB — COMPREHENSIVE METABOLIC PANEL
ALT: 18 IU/L (ref 0–32)
AST: 20 IU/L (ref 0–40)
Albumin/Globulin Ratio: 1.6 (ref 1.2–2.2)
Albumin: 4.2 g/dL (ref 3.8–4.8)
Alkaline Phosphatase: 84 IU/L (ref 44–121)
BUN/Creatinine Ratio: 11 (ref 9–23)
BUN: 9 mg/dL (ref 6–24)
Bilirubin Total: 0.4 mg/dL (ref 0.0–1.2)
CO2: 22 mmol/L (ref 20–29)
Calcium: 9 mg/dL (ref 8.7–10.2)
Chloride: 101 mmol/L (ref 96–106)
Creatinine, Ser: 0.8 mg/dL (ref 0.57–1.00)
Globulin, Total: 2.7 g/dL (ref 1.5–4.5)
Glucose: 86 mg/dL (ref 65–99)
Potassium: 4.3 mmol/L (ref 3.5–5.2)
Sodium: 139 mmol/L (ref 134–144)
Total Protein: 6.9 g/dL (ref 6.0–8.5)
eGFR: 92 mL/min/{1.73_m2} (ref >59)

## 2021-02-27 LAB — VITAMIN B12: Vitamin B-12: 781 pg/mL (ref 232–1245)

## 2021-02-27 LAB — VITAMIN D 25 HYDROXY (VIT D DEFICIENCY, FRACTURES): Vit D, 25-Hydroxy: 21.6 ng/mL — ABNORMAL LOW (ref 30.0–100.0)

## 2021-03-01 ENCOUNTER — Other Ambulatory Visit: Payer: Self-pay

## 2021-03-01 DIAGNOSIS — E559 Vitamin D deficiency, unspecified: Secondary | ICD-10-CM

## 2021-03-01 LAB — CYTOLOGY - PAP
Adequacy: ABSENT
Comment: NEGATIVE
Diagnosis: NEGATIVE
High risk HPV: NEGATIVE

## 2021-03-01 MED ORDER — VITAMIN D (ERGOCALCIFEROL) 1.25 MG (50000 UNIT) PO CAPS: 50000.0000 [IU] | ORAL_CAPSULE | ORAL | 1 refills | Status: AC

## 2021-04-12 DIAGNOSIS — E7800 Pure hypercholesterolemia, unspecified: Secondary | ICD-10-CM | POA: Insufficient documentation

## 2021-08-05 ENCOUNTER — Telehealth: Payer: Self-pay

## 2021-08-05 NOTE — Telephone Encounter (Signed)
Called pt to remind her to schedule mammogram. She said she would like Mammo order faxed to [facility] in [place].   Placed written order in Dr Karn Cassis box- just needs signed and faxed when she returns to clinic.

## 2021-09-02 ENCOUNTER — Encounter: Payer: Self-pay | Admitting: Internal Medicine

## 2021-10-12 ENCOUNTER — Ambulatory Visit: Payer: BC Managed Care – PPO | Admitting: Internal Medicine

## 2021-10-12 ENCOUNTER — Encounter: Payer: Self-pay | Admitting: Internal Medicine

## 2021-10-12 ENCOUNTER — Other Ambulatory Visit: Payer: Self-pay

## 2021-10-12 VITALS — BP 100/68 | HR 78 | Temp 99.3°F | Ht 62.0 in | Wt 195.0 lb

## 2021-10-12 DIAGNOSIS — R509 Fever, unspecified: Secondary | ICD-10-CM

## 2021-10-12 DIAGNOSIS — J0100 Acute maxillary sinusitis, unspecified: Secondary | ICD-10-CM | POA: Diagnosis not present

## 2021-10-12 LAB — POCT INFLUENZA A/B
Influenza A, POC: NEGATIVE
Influenza B, POC: NEGATIVE

## 2021-10-12 MED ORDER — GUAIFENESIN-CODEINE 100-10 MG/5ML PO SYRP: 5.0000 mL | ORAL_SOLUTION | Freq: Three times a day (TID) | ORAL | 0 refills | Status: DC | PRN

## 2021-10-12 MED ORDER — DOXYCYCLINE HYCLATE 100 MG PO TABS: 100.0000 mg | ORAL_TABLET | Freq: Two times a day (BID) | ORAL | 0 refills | Status: AC

## 2021-10-12 NOTE — Progress Notes (Signed)
Date:  10/12/2021   Name:  Melinda Nelson   DOB:  June 28, 1976   MRN:  [id number]   Chief Complaint: Fever  Cough This is a new problem. The current episode started 1 to 4 weeks ago. The problem has been waxing and waning. The cough is Productive of sputum (light pink, and slight yellow in color). Associated symptoms include a fever, headaches, myalgias, nasal congestion, postnasal drip, a sore throat, shortness of breath and sweats. Pertinent negatives include no chest pain, chills, ear pain or wheezing.  Sinus Problem This is a new problem. The current episode started in the past 7 days. The problem has been gradually worsening since onset. The maximum temperature recorded prior to her arrival was 100.4 - 100.9 F. The fever has been present for 1 to 2 days. The pain is mild. Associated symptoms include congestion, coughing, headaches, shortness of breath, sinus pressure and a sore throat. Pertinent negatives include no chills or ear pain. Past treatments include nothing.   Lab Results  Component Value Date   CREATININE 0.80 02/26/2021   BUN 9 02/26/2021   NA 139 02/26/2021   K 4.3 02/26/2021   CL 101 02/26/2021   CO2 22 02/26/2021   Lab Results  Component Value Date   CHOL 193 02/26/2021   HDL 42 02/26/2021   LDLCALC 125 (H) 02/26/2021   TRIG 148 02/26/2021   CHOLHDL 4.6 (H) 02/26/2021   Lab Results  Component Value Date   TSH 4.870 (H) 02/26/2021   No results found for: HGBA1C Lab Results  Component Value Date   WBC 4.7 02/26/2021   HGB 12.2 02/26/2021   HCT 36.4 02/26/2021   MCV 87 02/26/2021   PLT 238 02/26/2021   Lab Results  Component Value Date   ALT 18 02/26/2021   AST 20 02/26/2021   ALKPHOS 84 02/26/2021   BILITOT 0.4 02/26/2021     Review of Systems  Constitutional:  Positive for fatigue and fever. Negative for chills.  HENT:  Positive for congestion, postnasal drip, sinus pressure and sore throat. Negative for ear pain.   Respiratory:  Positive for  cough and shortness of breath. Negative for wheezing.   Cardiovascular:  Negative for chest pain and palpitations.  Musculoskeletal:  Positive for myalgias.  Neurological:  Positive for headaches. Negative for dizziness.   Patient Active Problem List   Diagnosis Date Noted   Elevated LDL cholesterol level 04/12/2021   Breast cyst, left 09/07/2018   Vitamin D deficiency 05/20/2016   Vitamin B12 deficiency 05/20/2016   Other headache syndrome 05/20/2016   Neuropathy 05/20/2016   IBS (irritable bowel syndrome) 02/19/2016    Allergies  Allergen Reactions   Lamisil [Terbinafine]     Skin reaction   Penicillins     Rash   Tramadol Nausea And Vomiting    Past Surgical History:  Procedure Laterality Date   CESAREAN SECTION     x 3   TUBAL LIGATION  2009    Social History   Tobacco Use   Smoking status: Never   Smokeless tobacco: Never  Vaping Use   Vaping Use: Never used  Substance Use Topics   Alcohol use: Yes    Alcohol/week: 0.0 standard drinks    Comment: rarely   Drug use: No     Medication list has been reviewed and updated.  Current Meds  Medication Sig   Ascorbic Acid (VITAMIN C) 1000 MG tablet Take 1,000 mg by mouth daily.   cholecalciferol (VITAMIN D)  1000 units tablet Take 1,000 Units by mouth daily.   cyanocobalamin (,VITAMIN B-12,) 1000 MCG/ML injection Inject 1 mL (1,000 mcg total) into the muscle every 14 (fourteen) days.   cyclobenzaprine (FLEXERIL) 10 MG tablet Take 1 tablet (10 mg total) by mouth 3 (three) times daily as needed for muscle spasms. (Patient taking differently: Take 10 mg by mouth as needed for muscle spasms.)   doxycycline (VIBRA-TABS) 100 MG tablet Take 1 tablet (100 mg total) by mouth 2 (two) times daily for 10 days.   guaiFENesin-codeine (ROBITUSSIN AC) 100-10 MG/5ML syrup Take 5 mLs by mouth 3 (three) times daily as needed for cough.   nitrofurantoin, macrocrystal-monohydrate, (MACROBID) 100 MG capsule 0ne capsule daily as needed  (Patient taking differently: as needed. 0ne capsule daily as needed)   Vitamin D, Ergocalciferol, (DRISDOL) 1.25 MG (50000 UNIT) CAPS capsule Take 1 capsule (50,000 Units total) by mouth every 7 (seven) days.    PHQ 2/9 Scores 10/12/2021 02/26/2021 06/26/2020 05/26/2020  PHQ - 2 Score 0 0 0 0  PHQ- 9 Score 0 0 0 0    GAD 7 : Generalized Anxiety Score 10/12/2021 02/26/2021 06/26/2020 05/26/2020  Nervous, Anxious, on Edge 0 0 0 0  Control/stop worrying 0 0 0 0  Worry too much - different things 0 0 0 0  Trouble relaxing 0 0 0 0  Restless 0 0 0 0  Easily annoyed or irritable 0 0 0 0  Afraid - awful might happen 0 0 0 0  Total GAD 7 Score 0 0 0 0  Anxiety Difficulty Not difficult at all - Not difficult at all Not difficult at all    BP Readings from Last 3 Encounters:  10/12/21 100/68  02/26/21 136/86  08/10/20 130/84    Physical Exam Constitutional:      Appearance: Normal appearance. She is well-developed.  HENT:     Right Ear: Ear canal and external ear normal. Tympanic membrane is not erythematous or retracted.     Left Ear: Ear canal and external ear normal. Tympanic membrane is not erythematous or retracted.     Nose:     Right Sinus: Maxillary sinus tenderness present. No frontal sinus tenderness.     Left Sinus: Maxillary sinus tenderness present. No frontal sinus tenderness.     Mouth/Throat:     Mouth: No oral lesions.  Cardiovascular:     Rate and Rhythm: Normal rate and regular rhythm.     Heart sounds: Normal heart sounds.  Pulmonary:     Breath sounds: Normal breath sounds and air entry. No wheezing, rhonchi or rales.  Musculoskeletal:     Cervical back: Normal range of motion.  Lymphadenopathy:     Cervical: No cervical adenopathy.  Neurological:     Mental Status: She is alert and oriented to person, place, and time.    Wt Readings from Last 3 Encounters:  10/12/21 195 lb (88.5 kg)  02/26/21 201 lb (91.2 kg)  08/10/20 193 lb (87.5 kg)    BP 100/68   Pulse  78   Temp 99.3 F (37.4 C) (Oral)   Ht 5\' 2"  (1.575 m)   Wt 195 lb (88.5 kg)   SpO2 99%   BMI 35.67 kg/m   Assessment and Plan: 1. Acute non-recurrent maxillary sinusitis Begin flonase nasal spray daily; increase fluids, rest - guaiFENesin-codeine (ROBITUSSIN AC) 100-10 MG/5ML syrup; Take 5 mLs by mouth 3 (three) times daily as needed for cough.  Dispense: 118 mL; Refill: 0 - doxycycline (VIBRA-TABS)  100 MG tablet; Take 1 tablet (100 mg total) by mouth 2 (two) times daily for 10 days.  Dispense: 20 tablet; Refill: 0  2. Fever, unspecified fever cause Low grade due to #1 above - POCT Influenza A/B - negative for A and B   Partially dictated using Animal nutritionist. Any errors are unintentional.  Bari Edward, MD [facility]  10/12/2021

## 2021-10-27 ENCOUNTER — Other Ambulatory Visit: Payer: Self-pay | Admitting: Internal Medicine

## 2021-10-27 DIAGNOSIS — N3090 Cystitis, unspecified without hematuria: Secondary | ICD-10-CM

## 2021-10-27 NOTE — Telephone Encounter (Signed)
Requested medications are due for refill today.  unknown  Requested medications are on the active medications list.  yes  Last refill. 09/27/2020  Future visit scheduled.   yes  Notes to clinic.  Medication is not assigned to a protocol.

## 2021-12-17 ENCOUNTER — Ambulatory Visit (INDEPENDENT_AMBULATORY_CARE_PROVIDER_SITE_OTHER): Payer: BC Managed Care – PPO | Admitting: Internal Medicine

## 2021-12-17 ENCOUNTER — Other Ambulatory Visit: Payer: Self-pay

## 2021-12-17 ENCOUNTER — Ambulatory Visit: Payer: Self-pay

## 2021-12-17 ENCOUNTER — Encounter: Payer: Self-pay | Admitting: Internal Medicine

## 2021-12-17 VITALS — BP 136/86 | HR 65 | Ht 62.0 in | Wt 197.6 lb

## 2021-12-17 DIAGNOSIS — N920 Excessive and frequent menstruation with regular cycle: Secondary | ICD-10-CM | POA: Diagnosis not present

## 2021-12-17 NOTE — Telephone Encounter (Signed)
°  Chief Complaint: menstrual cramps Symptoms: severe cramps, large sized clot, heavier bleeding, fatigue Frequency: 1 day Pertinent Negatives: NA Disposition: [] ED /[] Urgent Care (no appt availability in office) / [x] Appointment(In office/virtual)/ []  [facility]/ [] Home Care/ [] Refused Recommended Disposition /[] [facility]/ []  Follow-up with PCP Additional Notes: pt states her cramps are normally mild but last night were severe and continue today. Also passed a large clot the size of 1/2 a tennis ball per pt followed by smaller clots, and flow has been a bit heavier. Scheduled pt for appt today at 1520 with Dr. .    Reason for Disposition  [1] SEVERE pain (e.g., excruciating cramps) AND [2] worse than ever before AND [3] not improved with ibuprofen or naproxen  Answer Assessment - Initial Assessment Questions 1. LOCATION: "Where does it hurt?"      Abdominal cramps 2. ONSET: "When did this episode of pain begin?"       Last night 3. SEVERITY: "How bad is the pain?" "Are you missing school or work because of the pain?"  (e.g., Scale 1-10; mild, moderate, or severe)   - MILD (1-3): doesn't interfere with normal activities, lasting 1-2 days    - MODERATE (4-7): interferes with normal activities (missing work or school), lasting 2-3 days, some associated GI symptoms    - SEVERE (8-10): excruciating pain, lasting 2-7 days, associated GI symptoms, pain radiating into thighs and back     moderate 4. VAGINAL BLEEDING: "Describe the bleeding that you are having." "How much bleeding is there?"    - SPOTTING: spotting, or pinkish / brownish mucous discharge; does not fill panty liner or pad    - MILD:  less than 1 pad / hour; less than patient's usual menstrual bleeding   - MODERATE: 1-2 pads / hour; 1 menstrual cup every 6 hours; small-medium blood clots (e.g., pea, grape, small coin)   - SEVERE: soaking 2 or more pads/hour for 2 or more hours; 1 menstrual cup every 2  hours; bleeding not contained by pads or continuous red blood from vagina; large blood clots (e.g., golf ball, large coin)      Clot size of half of tennis ball followed by smaller clots 5. MENSTRUAL HISTORY:  "When did this menstrual period begin?", "Is this a normal period for you?"       This week 6. LMP:  "When did your last menstrual period begin?"     Last month 7. OTHER SYMPTOMS: "Do you have any other symptoms?" (e.g., back pain, diarrhea, dizzy or lightheaded, fever, urination pain, vaginal discharge, vomiting)     Fatigue, feeling overall heaviness involving body.  Protocols used: Abdominal Pain - Menstrual Cramps-A-AH

## 2021-12-17 NOTE — Progress Notes (Signed)
Date:  12/17/2021   Name:  Melinda Nelson   DOB:  11-22-1976   MRN:  [id number]   Chief Complaint: Dysmenorrhea  Dysmenorrhea: Her symptoms have gotten worse with in the last 24 hours. Her cramps are extremely painful, and she is passing large blood clots- She said the last one was the size of a tennis ball. Associated symptoms include headaches. Her menstrual cycle lasts around 5 days.  Lab Results  Component Value Date   NA 139 02/26/2021   K 4.3 02/26/2021   CO2 22 02/26/2021   GLUCOSE 86 02/26/2021   BUN 9 02/26/2021   CREATININE 0.80 02/26/2021   CALCIUM 9.0 02/26/2021   EGFR 92 02/26/2021   GFRNONAA 77 02/25/2020   Lab Results  Component Value Date   CHOL 193 02/26/2021   HDL 42 02/26/2021   LDLCALC 125 (H) 02/26/2021   TRIG 148 02/26/2021   CHOLHDL 4.6 (H) 02/26/2021   Lab Results  Component Value Date   TSH 4.870 (H) 02/26/2021   No results found for: HGBA1C Lab Results  Component Value Date   WBC 4.7 02/26/2021   HGB 12.2 02/26/2021   HCT 36.4 02/26/2021   MCV 87 02/26/2021   PLT 238 02/26/2021   Lab Results  Component Value Date   ALT 18 02/26/2021   AST 20 02/26/2021   ALKPHOS 84 02/26/2021   BILITOT 0.4 02/26/2021   Lab Results  Component Value Date   VD25OH 21.6 (L) 02/26/2021     Review of Systems  Constitutional:  Negative for chills, fatigue and fever.  Respiratory:  Negative for chest tightness and shortness of breath.   Cardiovascular:  Negative for chest pain.  Gastrointestinal:  Positive for abdominal pain (mild pelvic cramping and pressure). Negative for diarrhea and vomiting.  Genitourinary:  Positive for menstrual problem (passed large clot with heaving bleeding this am - lasted 1-2 hours and is now lighter.).   Patient Active Problem List   Diagnosis Date Noted   Elevated LDL cholesterol level 04/12/2021   Breast cyst, left 09/07/2018   Vitamin D deficiency 05/20/2016   Vitamin B12 deficiency 05/20/2016   Other headache  syndrome 05/20/2016   Neuropathy 05/20/2016   IBS (irritable bowel syndrome) 02/19/2016    Allergies  Allergen Reactions   Lamisil [Terbinafine]     Skin reaction   Penicillins     Rash   Tramadol Nausea And Vomiting    Past Surgical History:  Procedure Laterality Date   CESAREAN SECTION     x 3   TUBAL LIGATION  2009    Social History   Tobacco Use   Smoking status: Never   Smokeless tobacco: Never  Vaping Use   Vaping Use: Never used  Substance Use Topics   Alcohol use: Yes    Alcohol/week: 0.0 standard drinks    Comment: rarely   Drug use: No     Medication list has been reviewed and updated.  Current Meds  Medication Sig   Ascorbic Acid (VITAMIN C) 1000 MG tablet Take 1,000 mg by mouth daily.   cholecalciferol (VITAMIN D) 1000 units tablet Take 1,000 Units by mouth daily.   cyanocobalamin (,VITAMIN B-12,) 1000 MCG/ML injection Inject 1 mL (1,000 mcg total) into the muscle every 14 (fourteen) days.   cyclobenzaprine (FLEXERIL) 10 MG tablet Take 1 tablet (10 mg total) by mouth 3 (three) times daily as needed for muscle spasms. (Patient taking differently: Take 10 mg by mouth as needed for muscle spasms.)  guaiFENesin-codeine (ROBITUSSIN AC) 100-10 MG/5ML syrup Take 5 mLs by mouth 3 (three) times daily as needed for cough.   nitrofurantoin, macrocrystal-monohydrate, (MACROBID) 100 MG capsule 0NE CAPSULE DAILY AS NEEDED   Vitamin D, Ergocalciferol, (DRISDOL) 1.25 MG (50000 UNIT) CAPS capsule Take 1 capsule (50,000 Units total) by mouth every 7 (seven) days.    PHQ 2/9 Scores 12/17/2021 10/12/2021 02/26/2021 06/26/2020  PHQ - 2 Score 0 0 0 0  PHQ- 9 Score 0 0 0 0    GAD 7 : Generalized Anxiety Score 12/17/2021 10/12/2021 02/26/2021 06/26/2020  Nervous, Anxious, on Edge 0 0 0 0  Control/stop worrying 0 0 0 0  Worry too much - different things 0 0 0 0  Trouble relaxing 0 0 0 0  Restless 0 0 0 0  Easily annoyed or irritable 0 0 0 0  Afraid - awful might happen 0 0 0 0   Total GAD 7 Score 0 0 0 0  Anxiety Difficulty Not difficult at all Not difficult at all - Not difficult at all    BP Readings from Last 3 Encounters:  12/17/21 136/86  10/12/21 100/68  02/26/21 136/86    Physical Exam Constitutional:      Appearance: Normal appearance.  Cardiovascular:     Rate and Rhythm: Normal rate and regular rhythm.  Pulmonary:     Effort: Pulmonary effort is normal.     Breath sounds: No wheezing or rhonchi.  Abdominal:     General: Abdomen is flat. Bowel sounds are normal.     Palpations: Abdomen is soft. There is no mass.     Tenderness: There is no abdominal tenderness. There is no right CVA tenderness, left CVA tenderness or guarding.  Musculoskeletal:     Cervical back: Normal range of motion.  Neurological:     Mental Status: She is alert.    Wt Readings from Last 3 Encounters:  12/17/21 197 lb 9.6 oz (89.6 kg)  10/12/21 195 lb (88.5 kg)  02/26/21 201 lb (91.2 kg)    BP 136/86 (BP Location: Right Arm, Cuff Size: Normal)    Pulse 65    Ht 5' 2"  (1.575 m)    Wt 197 lb 9.6 oz (89.6 kg)    LMP 12/16/2021 (Exact Date)    SpO2 97%    BMI 36.14 kg/m   Assessment and Plan: 1. Menorrhagia with regular cycle No worrisome findings on hx or exam Suspect this is peri-menopausal in nature.  She also has a hx of asx fibroids noted 14 years ago. Continue Advil and heat as needed. If persistent, will obtain Pelvic US.    Partially dictated using Editor, commissioning. Any errors are unintentional.  Halina Maidens, MD [facility]  12/17/2021

## 2022-03-02 ENCOUNTER — Other Ambulatory Visit: Payer: Self-pay

## 2022-03-02 ENCOUNTER — Ambulatory Visit (INDEPENDENT_AMBULATORY_CARE_PROVIDER_SITE_OTHER): Payer: BC Managed Care – PPO | Admitting: Internal Medicine

## 2022-03-02 ENCOUNTER — Encounter: Payer: Self-pay | Admitting: Internal Medicine

## 2022-03-02 VITALS — BP 110/80 | HR 61 | Ht 62.0 in | Wt 199.0 lb

## 2022-03-02 DIAGNOSIS — Z1231 Encounter for screening mammogram for malignant neoplasm of breast: Secondary | ICD-10-CM

## 2022-03-02 DIAGNOSIS — Z1159 Encounter for screening for other viral diseases: Secondary | ICD-10-CM

## 2022-03-02 DIAGNOSIS — E559 Vitamin D deficiency, unspecified: Secondary | ICD-10-CM | POA: Diagnosis not present

## 2022-03-02 DIAGNOSIS — Z0000 Encounter for general adult medical examination without abnormal findings: Secondary | ICD-10-CM

## 2022-03-02 DIAGNOSIS — E7800 Pure hypercholesterolemia, unspecified: Secondary | ICD-10-CM

## 2022-03-02 DIAGNOSIS — E538 Deficiency of other specified B group vitamins: Secondary | ICD-10-CM

## 2022-03-02 NOTE — Progress Notes (Signed)
? ? ?Date:  03/02/2022  ? ?Name:  Melinda Nelson   DOB:  01/10/1976   MRN:  [id number] ? ? ?Chief Complaint: No chief complaint on file. ?Melinda Nelson is a 46 y.o. female who presents today for her Complete Annual Exam. She feels well. She reports exercising walking daily. She reports she is sleeping well. Breast complaints none.  She has cut back on sugar and it having much less joint stiffness. ? ?Mammogram: 08/2021 [facility] ?DEXA: none ?Pap smear: 02/2021 neg with co-testing ?Colonoscopy: none ? ?Immunization History  ?Administered Date(s) Administered  ? Influenza,inj,Quad PF,6+ Mos 10/26/2016, 09/07/2018  ? Influenza-Unspecified 10/16/2015  ? Janssen (J&J) SARS-COV-2 Vaccination 03/23/2020  ? Td 03/09/2008  ? Tdap 02/20/2019  ? ? ?HPI ? ?Lab Results  ?Component Value Date  ? NA 139 02/26/2021  ? K 4.3 02/26/2021  ? CO2 22 02/26/2021  ? GLUCOSE 86 02/26/2021  ? BUN 9 02/26/2021  ? CREATININE 0.80 02/26/2021  ? CALCIUM 9.0 02/26/2021  ? EGFR 92 02/26/2021  ? GFRNONAA 77 02/25/2020  ? ?Lab Results  ?Component Value Date  ? CHOL 193 02/26/2021  ? HDL 42 02/26/2021  ? LDLCALC 125 (H) 02/26/2021  ? TRIG 148 02/26/2021  ? CHOLHDL 4.6 (H) 02/26/2021  ? ?Lab Results  ?Component Value Date  ? TSH 4.870 (H) 02/26/2021  ? ?No results found for: HGBA1C ?Lab Results  ?Component Value Date  ? WBC 4.7 02/26/2021  ? HGB 12.2 02/26/2021  ? HCT 36.4 02/26/2021  ? MCV 87 02/26/2021  ? PLT 238 02/26/2021  ? ?Lab Results  ?Component Value Date  ? ALT 18 02/26/2021  ? AST 20 02/26/2021  ? ALKPHOS 84 02/26/2021  ? BILITOT 0.4 02/26/2021  ? ?Lab Results  ?Component Value Date  ? VD25OH 21.6 (L) 02/26/2021  ?  ? ?Review of Systems  ?Constitutional:  Negative for chills, fatigue and fever.  ?HENT:  Negative for congestion, hearing loss, tinnitus, trouble swallowing and voice change.   ?Eyes:  Negative for visual disturbance.  ?Respiratory:  Negative for cough, chest tightness, shortness of breath and wheezing.   ?Cardiovascular:  Negative for  chest pain, palpitations and leg swelling.  ?Gastrointestinal:  Negative for abdominal pain, constipation, diarrhea and vomiting.  ?Endocrine: Negative for polydipsia and polyuria.  ?Genitourinary:  Negative for dysuria, frequency, genital sores, vaginal bleeding and vaginal discharge.  ?Musculoskeletal:  Negative for arthralgias, gait problem and joint swelling.  ?Skin:  Negative for color change and rash.  ?     Nail fungus on 2 right toes  ?Neurological:  Negative for dizziness, tremors, light-headedness and headaches.  ?Hematological:  Negative for adenopathy. Does not bruise/bleed easily.  ?Psychiatric/Behavioral:  Negative for dysphoric mood and sleep disturbance. The patient is not nervous/anxious.   ? ?Patient Active Problem List  ? Diagnosis Date Noted  ? Elevated LDL cholesterol level 04/12/2021  ? Breast cyst, left 09/07/2018  ? Vitamin D deficiency 05/20/2016  ? Vitamin B12 deficiency 05/20/2016  ? Other headache syndrome 05/20/2016  ? Neuropathy 05/20/2016  ? IBS (irritable bowel syndrome) 02/19/2016  ? ? ?Allergies  ?Allergen Reactions  ? Lamisil [Terbinafine]   ?  Skin reaction  ? Penicillins   ?  Rash  ? Tramadol Nausea And Vomiting  ? ? ?Past Surgical History:  ?Procedure Laterality Date  ? CESAREAN SECTION    ? x 3  ? TUBAL LIGATION  2009  ? ? ?Social History  ? ?Tobacco Use  ? Smoking status: Never  ?  Smokeless tobacco: Never  ?Vaping Use  ? Vaping Use: Never used  ?Substance Use Topics  ? Alcohol use: Yes  ?  Alcohol/week: 0.0 standard drinks  ?  Comment: rarely  ? Drug use: No  ? ? ? ?Medication list has been reviewed and updated. ? ?Current Meds  ?Medication Sig  ? Ascorbic Acid (VITAMIN C) 1000 MG tablet Take 1,000 mg by mouth daily.  ? cholecalciferol (VITAMIN D) 1000 units tablet Take 1,000 Units by mouth daily.  ? cyanocobalamin (,VITAMIN B-12,) 1000 MCG/ML injection Inject 1 mL (1,000 mcg total) into the muscle every 14 (fourteen) days. (Patient taking differently: Inject 1,000 mcg into  the muscle as needed.)  ? cyclobenzaprine (FLEXERIL) 10 MG tablet Take 1 tablet (10 mg total) by mouth 3 (three) times daily as needed for muscle spasms. (Patient taking differently: Take 10 mg by mouth as needed for muscle spasms.)  ? MAGNESIUM PO Take by mouth daily.  ? nitrofurantoin, macrocrystal-monohydrate, (MACROBID) 100 MG capsule 0NE CAPSULE DAILY AS NEEDED  ? Vitamin D, Ergocalciferol, (DRISDOL) 1.25 MG (50000 UNIT) CAPS capsule Take 1 capsule (50,000 Units total) by mouth every 7 (seven) days.  ? ? ?PHQ 2/9 Scores 03/02/2022 12/17/2021 10/12/2021 02/26/2021  ?PHQ - 2 Score 0 0 0 0  ?PHQ- 9 Score 0 0 0 0  ? ? ?GAD 7 : Generalized Anxiety Score 03/02/2022 12/17/2021 10/12/2021 02/26/2021  ?Nervous, Anxious, on Edge 0 0 0 0  ?Control/stop worrying 0 0 0 0  ?Worry too much - different things 0 0 0 0  ?Trouble relaxing 0 0 0 0  ?Restless 0 0 0 0  ?Easily annoyed or irritable 0 0 0 0  ?Afraid - awful might happen 0 0 0 0  ?Total GAD 7 Score 0 0 0 0  ?Anxiety Difficulty - Not difficult at all Not difficult at all -  ? ? ?BP Readings from Last 3 Encounters:  ?03/02/22 110/80  ?12/17/21 136/86  ?10/12/21 100/68  ? ? ?Physical Exam ?Vitals and nursing note reviewed.  ?Constitutional:   ?   General: She is not in acute distress. ?   Appearance: She is well-developed.  ?HENT:  ?   Head: Normocephalic and atraumatic.  ?   Right Ear: Tympanic membrane and ear canal normal.  ?   Left Ear: Tympanic membrane and ear canal normal.  ?   Nose:  ?   Right Sinus: No maxillary sinus tenderness.  ?Eyes:  ?   General: No scleral icterus.    ?   Right eye: No discharge.     ?   Left eye: No discharge.  ?   Conjunctiva/sclera: Conjunctivae normal.  ?Neck:  ?   Thyroid: No thyromegaly.  ?   Vascular: No carotid bruit.  ?Cardiovascular:  ?   Rate and Rhythm: Normal rate and regular rhythm.  ?   Pulses: Normal pulses.  ?   Heart sounds: Normal heart sounds.  ?Pulmonary:  ?   Effort: Pulmonary effort is normal. No respiratory distress.  ?    Breath sounds: No wheezing.  ?Chest:  ?Breasts: ?   Right: No mass, nipple discharge, skin change or tenderness.  ?   Left: No mass, nipple discharge, skin change or tenderness.  ?Abdominal:  ?   General: Bowel sounds are normal.  ?   Palpations: Abdomen is soft.  ?   Tenderness: There is no abdominal tenderness.  ?Musculoskeletal:  ?   Cervical back: Normal range of motion. No erythema.  ?  Right lower leg: No edema.  ?   Left lower leg: No edema.  ?Lymphadenopathy:  ?   Cervical: No cervical adenopathy.  ?Skin: ?   General: Skin is warm and dry.  ?   Findings: No rash.  ?   Comments: Nails on 2nd and 4th right toes thickened, non tender  ?Neurological:  ?   Mental Status: She is alert and oriented to person, place, and time.  ?   Cranial Nerves: No cranial nerve deficit.  ?   Sensory: No sensory deficit.  ?   Deep Tendon Reflexes: Reflexes are normal and symmetric.  ?Psychiatric:     ?   Attention and Perception: Attention normal.     ?   Mood and Affect: Mood normal.  ? ? ?Wt Readings from Last 3 Encounters:  ?03/02/22 199 lb (90.3 kg)  ?12/17/21 197 lb 9.6 oz (89.6 kg)  ?10/12/21 195 lb (88.5 kg)  ? ? ?BP 110/80   Pulse 61   Ht _0  (1.575 m)   Wt 199 lb (90.3 kg)   SpO2 99%   BMI 36.40 kg/m?  ? ?Assessment and Plan: ?1. Annual physical exam ?Normal exam except for weight. ?Continue healthy diet and exercise. ?Up to date on screenings and immunizations. ?Declines CRC screening today ?- CBC with Differential/Platelet ?- Comprehensive metabolic panel ?- TSH ? ?2. Encounter for screening mammogram for breast cancer ?Schedule in September at [facility]. ? ?3. Elevated LDL cholesterol level ?- Lipid panel ? ?4. Vitamin D deficiency ?Continue supplements daily ?- VITAMIN D 25 Hydroxy (Vit-D Deficiency, Fractures) ? ?5. Vitamin B12 deficiency ?Continue 1-2 times per month ?- Vitamin B12 ? ?6. Need for hepatitis C screening test ?- Hepatitis C antibody ? ? ?Partially dictated using Editor, commissioning. Any errors are  unintentional. ? ?Halina Maidens, MD ?[facility] ?[facility] ? ?03/02/2022 ? ? ? ? ?

## 2022-03-03 LAB — COMPREHENSIVE METABOLIC PANEL
ALT: 10 IU/L (ref 0–32)
AST: 10 IU/L (ref 0–40)
Albumin/Globulin Ratio: 1.4 (ref 1.2–2.2)
Albumin: 4 g/dL (ref 3.8–4.8)
Alkaline Phosphatase: 78 IU/L (ref 44–121)
BUN/Creatinine Ratio: 12 (ref 9–23)
BUN: 12 mg/dL (ref 6–24)
Bilirubin Total: 0.3 mg/dL (ref 0.0–1.2)
CO2: 21 mmol/L (ref 20–29)
Calcium: 9.3 mg/dL (ref 8.7–10.2)
Chloride: 104 mmol/L (ref 96–106)
Creatinine, Ser: 0.98 mg/dL (ref 0.57–1.00)
Globulin, Total: 2.8 g/dL (ref 1.5–4.5)
Glucose: 90 mg/dL (ref 70–99)
Potassium: 5 mmol/L (ref 3.5–5.2)
Sodium: 138 mmol/L (ref 134–144)
Total Protein: 6.8 g/dL (ref 6.0–8.5)
eGFR: 72 mL/min/{1.73_m2} (ref >59)

## 2022-03-03 LAB — CBC WITH DIFFERENTIAL/PLATELET
Basophils Absolute: 0 10*3/uL (ref 0.0–0.2)
Basos: 1 %
EOS (ABSOLUTE): 0.3 10*3/uL (ref 0.0–0.4)
Eos: 6 %
Hematocrit: 33.5 % — ABNORMAL LOW (ref 34.0–46.6)
Hemoglobin: 11.2 g/dL (ref 11.1–15.9)
Immature Grans (Abs): 0 10*3/uL (ref 0.0–0.1)
Immature Granulocytes: 0 %
Lymphocytes Absolute: 2.2 10*3/uL (ref 0.7–3.1)
Lymphs: 39 %
MCH: 27.8 pg (ref 26.6–33.0)
MCHC: 33.4 g/dL (ref 31.5–35.7)
MCV: 83 fL (ref 79–97)
Monocytes Absolute: 0.5 10*3/uL (ref 0.1–0.9)
Monocytes: 9 %
Neutrophils Absolute: 2.5 10*3/uL (ref 1.4–7.0)
Neutrophils: 45 %
Platelets: 254 10*3/uL (ref 150–450)
RBC: 4.03 x10E6/uL (ref 3.77–5.28)
RDW: 13.3 % (ref 11.7–15.4)
WBC: 5.7 10*3/uL (ref 3.4–10.8)

## 2022-03-03 LAB — LIPID PANEL
Chol/HDL Ratio: 4.4 ratio (ref 0.0–4.4)
Cholesterol, Total: 169 mg/dL (ref 100–199)
HDL: 38 mg/dL — ABNORMAL LOW (ref >39)
LDL Chol Calc (NIH): 112 mg/dL — ABNORMAL HIGH (ref 0–99)
Triglycerides: 103 mg/dL (ref 0–149)
VLDL Cholesterol Cal: 19 mg/dL (ref 5–40)

## 2022-03-03 LAB — HEPATITIS C ANTIBODY: Hep C Virus Ab: NONREACTIVE

## 2022-03-03 LAB — TSH: TSH: 7.91 u[IU]/mL — ABNORMAL HIGH (ref 0.450–4.500)

## 2022-03-03 LAB — VITAMIN B12: Vitamin B-12: 257 pg/mL (ref 232–1245)

## 2022-03-03 LAB — VITAMIN D 25 HYDROXY (VIT D DEFICIENCY, FRACTURES): Vit D, 25-Hydroxy: 32.4 ng/mL (ref 30.0–100.0)

## 2022-03-04 LAB — T4: T4, Total: 6.3 ug/dL (ref 4.5–12.0)

## 2022-03-04 LAB — SPECIMEN STATUS REPORT

## 2022-11-02 ENCOUNTER — Other Ambulatory Visit: Payer: Self-pay | Admitting: Internal Medicine

## 2022-11-02 DIAGNOSIS — N3090 Cystitis, unspecified without hematuria: Secondary | ICD-10-CM

## 2022-11-02 NOTE — Telephone Encounter (Signed)
Requested medication (s) are due for refill today:   Yes  Requested medication (s) are on the active medication list:   Yes  Future visit scheduled:   Yes   Last ordered: 10/28/2021 #30, 2 refills  Returned because no protocol assigned to this medication     Requested Prescriptions  Pending Prescriptions Disp Refills   nitrofurantoin, macrocrystal-monohydrate, (MACROBID) 100 MG capsule [Pharmacy Med Name: NITROFURANTOIN MONO-MCR 100 MG] 30 capsule 2    Sig: 0NE CAPSULE DAILY AS NEEDED     Off-Protocol Failed - 11/02/2022  8:52 AM      Failed - Medication not assigned to a protocol, review manually.      Passed - Valid encounter within last 12 months    Recent Outpatient Visits           8 months ago Annual physical exam   [place] [facility] at [facility], Nyoka Cowden, MD   10 months ago Menorrhagia with regular cycle   [place] [facility] at [facility], Nyoka Cowden, MD   1 year ago Fever, unspecified fever cause   [place] [facility] at [facility], Nyoka Cowden, MD   1 year ago Annual physical exam   [facility] Primary Care and Sports Medicine at [facility], Nyoka Cowden, MD   2 years ago Tendonitis of wrist, left   [facility] Care and Sports Medicine at [facility], Nyoka Cowden, MD       Future Appointments             In 4 months Judithann Graves, Nyoka Cowden, MD [facility] Health Primary Care and Sports Medicine at [facility], [facility]

## 2022-11-10 ENCOUNTER — Other Ambulatory Visit: Payer: Self-pay | Admitting: Internal Medicine

## 2022-11-10 DIAGNOSIS — N3090 Cystitis, unspecified without hematuria: Secondary | ICD-10-CM

## 2022-11-10 NOTE — Telephone Encounter (Signed)
Requested medication (s) are due for refill today: routing for review  Requested medication (s) are on the active medication list: yes  Last refill:  10/28/21  Future visit scheduled:yes  Notes to clinic:  Unable to refill per protocol, should patient continue to take, routing for review     Requested Prescriptions  Pending Prescriptions Disp Refills   nitrofurantoin, macrocrystal-monohydrate, (MACROBID) 100 MG capsule [Pharmacy Med Name: NITROFURANTOIN MONO-MCR 100 MG] 30 capsule 2    Sig: 0NE CAPSULE DAILY AS NEEDED     Off-Protocol Failed - 11/10/2022  2:32 PM      Failed - Medication not assigned to a protocol, review manually.      Passed - Valid encounter within last 12 months    Recent Outpatient Visits           8 months ago Annual physical exam   [place] [facility] at [facility], Nyoka Cowden, MD   10 months ago Menorrhagia with regular cycle   [place] [facility] at [facility], Nyoka Cowden, MD   1 year ago Fever, unspecified fever cause   [place] [facility] at [facility], Nyoka Cowden, MD   1 year ago Annual physical exam   [facility] Primary Care and Sports Medicine at [facility], Nyoka Cowden, MD   2 years ago Tendonitis of wrist, left   [facility] Primary Care and Sports Medicine at [facility], Nyoka Cowden, MD       Future Appointments             In 3 months Judithann Graves, Nyoka Cowden, MD [facility] Health Primary Care and Sports Medicine at [facility], [facility]

## 2022-11-11 ENCOUNTER — Ambulatory Visit: Payer: BC Managed Care – PPO | Admitting: Internal Medicine

## 2022-11-11 ENCOUNTER — Encounter: Payer: Self-pay | Admitting: Internal Medicine

## 2022-11-11 DIAGNOSIS — N3090 Cystitis, unspecified without hematuria: Secondary | ICD-10-CM

## 2022-11-11 MED ORDER — NITROFURANTOIN MONOHYD MACRO 100 MG PO CAPS: ORAL_CAPSULE | ORAL | 3 refills | Status: DC

## 2022-11-11 NOTE — Patient Instructions (Signed)
Call [facility] Radiology for your Mammogram

## 2022-11-11 NOTE — Progress Notes (Signed)
Date:  11/11/2022   Name:  Melinda Nelson   DOB:  [birth year]   MRN:  [id number]   Chief Complaint: Urinary Tract Infection (No symptoms/)  Urinary Tract Infection  This is a recurrent problem. The problem occurs intermittently. There has been no fever. She has tried antibiotics (macrobid after intercourse for prevention) for the symptoms.    Lab Results  Component Value Date   NA 138 03/02/2022   K 5.0 03/02/2022   CO2 21 03/02/2022   GLUCOSE 90 03/02/2022   BUN 12 03/02/2022   CREATININE 0.98 03/02/2022   CALCIUM 9.3 03/02/2022   EGFR 72 03/02/2022   GFRNONAA 77 02/25/2020   Lab Results  Component Value Date   CHOL 169 03/02/2022   HDL 38 (L) 03/02/2022   LDLCALC 112 (H) 03/02/2022   TRIG 103 03/02/2022   CHOLHDL 4.4 03/02/2022   Lab Results  Component Value Date   TSH 7.910 (H) 03/02/2022   No results found for: "HGBA1C" Lab Results  Component Value Date   WBC 5.7 03/02/2022   HGB 11.2 03/02/2022   HCT 33.5 (L) 03/02/2022   MCV 83 03/02/2022   PLT 254 03/02/2022   Lab Results  Component Value Date   ALT 10 03/02/2022   AST 10 03/02/2022   ALKPHOS 78 03/02/2022   BILITOT 0.3 03/02/2022   Lab Results  Component Value Date   VD25OH 32.4 03/02/2022     Review of Systems  Constitutional:  Negative for fatigue and unexpected weight change.  HENT:  Negative for nosebleeds.   Eyes:  Negative for visual disturbance.  Respiratory:  Negative for cough, chest tightness, shortness of breath and wheezing.   Cardiovascular:  Negative for chest pain, palpitations and leg swelling.  Gastrointestinal:  Negative for abdominal pain, constipation and diarrhea.  Neurological:  Negative for dizziness, weakness, light-headedness and headaches.    Patient Active Problem List   Diagnosis Date Noted   Elevated LDL cholesterol level 04/12/2021   Breast cyst, left 09/07/2018   Vitamin D deficiency 05/20/2016   Vitamin B12 deficiency 05/20/2016   Other headache syndrome  05/20/2016   Neuropathy 05/20/2016   IBS (irritable bowel syndrome) 02/19/2016    Allergies  Allergen Reactions   Lamisil [Terbinafine]     Skin reaction   Tramadol Nausea And Vomiting   Penicillins Rash    Rash    Past Surgical History:  Procedure Laterality Date   CESAREAN SECTION     x 3   TUBAL LIGATION  2009    Social History   Tobacco Use   Smoking status: Never   Smokeless tobacco: Never  Vaping Use   Vaping Use: Never used  Substance Use Topics   Alcohol use: Yes    Alcohol/week: 0.0 standard drinks of alcohol    Comment: rarely   Drug use: No     Medication list has been reviewed and updated.  Current Meds  Medication Sig   Ascorbic Acid (VITAMIN C) 1000 MG tablet Take 1,000 mg by mouth daily.   cholecalciferol (VITAMIN D) 1000 units tablet Take 1,000 Units by mouth daily.   cyanocobalamin (,VITAMIN B-12,) 1000 MCG/ML injection Inject 1 mL (1,000 mcg total) into the muscle every 14 (fourteen) days. (Patient taking differently: Inject 1,000 mcg into the muscle as needed.)   cyclobenzaprine (FLEXERIL) 10 MG tablet Take 1 tablet (10 mg total) by mouth 3 (three) times daily as needed for muscle spasms. (Patient taking differently: Take 10 mg by mouth as needed  for muscle spasms.)   MAGNESIUM PO Take by mouth daily.   Vitamin D, Ergocalciferol, (DRISDOL) 1.25 MG (50000 UNIT) CAPS capsule Take 1 capsule (50,000 Units total) by mouth every 7 (seven) days.   [DISCONTINUED] nitrofurantoin, macrocrystal-monohydrate, (MACROBID) 100 MG capsule 0NE CAPSULE DAILY AS NEEDED       11/11/2022    1:31 PM 03/02/2022    8:03 AM 12/17/2021    3:48 PM 10/12/2021   11:31 AM  GAD 7 : Generalized Anxiety Score  Nervous, Anxious, on Edge 0 0 0 0  Control/stop worrying 0 0 0 0  Worry too much - different things 0 0 0 0  Trouble relaxing 0 0 0 0  Restless 0 0 0 0  Easily annoyed or irritable 0 0 0 0  Afraid - awful might happen 0 0 0 0  Total GAD 7 Score 0 0 0 0  Anxiety  Difficulty Not difficult at all  Not difficult at all Not difficult at all       11/11/2022    1:31 PM 03/02/2022    8:03 AM 12/17/2021    3:48 PM  Depression screen PHQ 2/9  Decreased Interest 0 0 0  Down, Depressed, Hopeless 0 0 0  PHQ - 2 Score 0 0 0  Altered sleeping 0 0 0  Tired, decreased energy 0 0 0  Change in appetite 0 0 0  Feeling bad or failure about yourself  0 0 0  Trouble concentrating 0 0 0  Moving slowly or fidgety/restless 0 0 0  Suicidal thoughts 0 0 0  PHQ-9 Score 0 0 0  Difficult doing work/chores Not difficult at all  Not difficult at all    BP Readings from Last 3 Encounters:  11/11/22 130/86  03/02/22 110/80  12/17/21 136/86    Physical Exam Constitutional:      Appearance: Normal appearance.  Cardiovascular:     Rate and Rhythm: Normal rate and regular rhythm.  Pulmonary:     Effort: Pulmonary effort is normal.     Breath sounds: No wheezing or rhonchi.  Abdominal:     Palpations: Abdomen is soft.     Tenderness: There is no abdominal tenderness.  Musculoskeletal:     Cervical back: Normal range of motion.  Neurological:     Mental Status: She is alert.  Psychiatric:        Mood and Affect: Mood normal.        Behavior: Behavior normal.     Wt Readings from Last 3 Encounters:  11/11/22 205 lb (93 kg)  03/02/22 199 lb (90.3 kg)  12/17/21 197 lb 9.6 oz (89.6 kg)    BP 130/86 (BP Location: Right Arm)   Pulse 65   Ht _0  (1.575 m)   Wt 205 lb (93 kg)   SpO2 99%   BMI 37.49 kg/m   Assessment and Plan: 1. Recurrent bacterial cystitis Continue post coital dosing with 3 days courses as needed - nitrofurantoin, macrocrystal-monohydrate, (MACROBID) 100 MG capsule; One po bid  Dispense: 90 capsule; Refill: 3   Partially dictated using Editor, commissioning. Any errors are unintentional.  Halina Maidens, MD [facility]  11/11/2022

## 2023-03-06 ENCOUNTER — Encounter: Payer: BC Managed Care – PPO | Admitting: Internal Medicine

## 2023-10-23 ENCOUNTER — Telehealth: Payer: BC Managed Care – PPO | Admitting: Physician Assistant

## 2023-10-23 DIAGNOSIS — M549 Dorsalgia, unspecified: Secondary | ICD-10-CM

## 2023-10-23 DIAGNOSIS — G8929 Other chronic pain: Secondary | ICD-10-CM

## 2023-10-23 MED ORDER — METHYLPREDNISOLONE 4 MG PO TBPK: ORAL_TABLET | Freq: Every day | ORAL | 0 refills | Status: AC

## 2023-10-23 MED ORDER — TIZANIDINE HCL 4 MG PO TABS: 4.0000 mg | ORAL_TABLET | Freq: Four times a day (QID) | ORAL | 0 refills | Status: AC | PRN

## 2023-10-23 NOTE — Progress Notes (Signed)
I have spent 5 minutes in review of e-visit questionnaire, review and updating patient chart, medical decision making and response to patient.   Laure Kidney, PA-C

## 2023-10-23 NOTE — Progress Notes (Signed)

## 2024-02-01 ENCOUNTER — Other Ambulatory Visit: Payer: Self-pay | Admitting: Internal Medicine

## 2024-02-01 DIAGNOSIS — N3090 Cystitis, unspecified without hematuria: Secondary | ICD-10-CM

## 2024-02-01 NOTE — Telephone Encounter (Signed)
Requested medication (s) are due for refill today - expired rx  Requested medication (s) are on the active medication list -yes  Future visit scheduled -no  Last refill: 11/11/22 #90 3RF  Notes to clinic: off protocol- provider review   Requested Prescriptions  Pending Prescriptions Disp Refills   nitrofurantoin, macrocrystal-monohydrate, (MACROBID) 100 MG capsule [Pharmacy Med Name: NITROFURANTOIN MONO-MCR 100 MG] 90 capsule 3    Sig: TAKE 1 CAPSULE BY MOUTH TWICE A DAY     Off-Protocol Failed - 02/01/2024 12:58 PM      Failed - Medication not assigned to a protocol, review manually.      Failed - Valid encounter within last 12 months    Recent Outpatient Visits           1 year ago Recurrent bacterial cystitis   [place] [facility] at [facility], Nyoka Cowden, MD   1 year ago Annual physical exam   [facility] at [facility], Nyoka Cowden, MD   2 years ago Menorrhagia with regular cycle   [facility] at [facility], Nyoka Cowden, MD   2 years ago Fever, unspecified fever cause   [place] [facility] at [facility], Nyoka Cowden, MD   2 years ago Annual physical exam   [facility] Care & [facility] Medicine at [facility], Nyoka Cowden, MD                 Requested Prescriptions  Pending Prescriptions Disp Refills   nitrofurantoin, macrocrystal-monohydrate, (MACROBID) 100 MG capsule [Pharmacy Med Name: NITROFURANTOIN MONO-MCR 100 MG] 90 capsule 3    Sig: TAKE 1 CAPSULE BY MOUTH TWICE A DAY     Off-Protocol Failed - 02/01/2024 12:58 PM      Failed - Medication not assigned to a protocol, review manually.      Failed - Valid encounter within last 12 months    Recent Outpatient Visits           1 year ago Recurrent bacterial cystitis   [place] [facility] at [facility],  Nyoka Cowden, MD   1 year ago Annual physical exam   [facility] Primary Care & Sports Medicine at [facility], Nyoka Cowden, MD   2 years ago Menorrhagia with regular cycle   [facility] Primary Care & Sports Medicine at [facility], Nyoka Cowden, MD   2 years ago Fever, unspecified fever cause   [facility] Health Primary Care & Sports Medicine at [facility], Nyoka Cowden, MD   2 years ago Annual physical exam   [facility] Primary Care & Sports Medicine at [facility], Nyoka Cowden, MD

## 2024-02-07 ENCOUNTER — Other Ambulatory Visit: Payer: Self-pay | Admitting: Internal Medicine

## 2024-02-07 DIAGNOSIS — N3090 Cystitis, unspecified without hematuria: Secondary | ICD-10-CM

## 2024-02-07 MED ORDER — NITROFURANTOIN MONOHYD MACRO 100 MG PO CAPS: ORAL_CAPSULE | ORAL | 3 refills | Status: DC

## 2024-02-07 NOTE — Telephone Encounter (Signed)
 Requested medication (s) are due for refill today: No  Requested medication (s) are on the active medication list: Yes  Last refill:  11/11/22  Future visit scheduled: No  Notes to clinic:  Unable to refill due to no refill protocol for this medication.      Requested Prescriptions  Pending Prescriptions Disp Refills   nitrofurantoin, macrocrystal-monohydrate, (MACROBID) 100 MG capsule [Pharmacy Med Name: NITROFURANTOIN MONO-MCR 100 MG] 90 capsule 3    Sig: TAKE 1 CAPSULE BY MOUTH TWICE A DAY     Off-Protocol Failed - 02/07/2024 12:20 PM      Failed - Medication not assigned to a protocol, review manually.      Failed - Valid encounter within last 12 months    Recent Outpatient Visits           1 year ago Recurrent bacterial cystitis    Primary Care & Sports Medicine at [facility], Nyoka Cowden, MD   1 year ago Annual physical exam   [facility] Primary Care & Sports Medicine at [facility], Nyoka Cowden, MD   2 years ago Menorrhagia with regular cycle   [facility] Primary Care & Sports Medicine at [facility], Nyoka Cowden, MD   2 years ago Fever, unspecified fever cause   [facility] Primary Care & Sports Medicine at [facility], Nyoka Cowden, MD   2 years ago Annual physical exam   [facility] Primary Care & Sports Medicine at [facility], Nyoka Cowden, MD

## 2024-02-07 NOTE — Telephone Encounter (Signed)
 Please review.  KP

## 2024-02-19 ENCOUNTER — Ambulatory Visit: Admitting: Internal Medicine

## 2024-02-19 ENCOUNTER — Encounter: Payer: Self-pay | Admitting: Internal Medicine

## 2024-02-19 ENCOUNTER — Other Ambulatory Visit: Payer: Self-pay | Admitting: Internal Medicine

## 2024-02-19 VITALS — BP 122/78 | HR 58 | Ht 62.0 in | Wt 205.2 lb

## 2024-02-19 DIAGNOSIS — Z1211 Encounter for screening for malignant neoplasm of colon: Secondary | ICD-10-CM | POA: Diagnosis not present

## 2024-02-19 DIAGNOSIS — E7800 Pure hypercholesterolemia, unspecified: Secondary | ICD-10-CM

## 2024-02-19 DIAGNOSIS — E538 Deficiency of other specified B group vitamins: Secondary | ICD-10-CM | POA: Diagnosis not present

## 2024-02-19 DIAGNOSIS — N3090 Cystitis, unspecified without hematuria: Secondary | ICD-10-CM | POA: Insufficient documentation

## 2024-02-19 DIAGNOSIS — Z0000 Encounter for general adult medical examination without abnormal findings: Secondary | ICD-10-CM

## 2024-02-19 DIAGNOSIS — E559 Vitamin D deficiency, unspecified: Secondary | ICD-10-CM

## 2024-02-19 DIAGNOSIS — E785 Hyperlipidemia, unspecified: Secondary | ICD-10-CM

## 2024-02-19 DIAGNOSIS — Z1231 Encounter for screening mammogram for malignant neoplasm of breast: Secondary | ICD-10-CM

## 2024-02-19 MED ORDER — NITROFURANTOIN MONOHYD MACRO 100 MG PO CAPS: ORAL_CAPSULE | ORAL | 3 refills | Status: AC

## 2024-02-19 MED ORDER — ESTRADIOL 0.1 MG/GM VA CREA: TOPICAL_CREAM | VAGINAL | 1 refills | Status: AC

## 2024-02-19 NOTE — Progress Notes (Signed)
 Date:  02/19/2024   Name:  Melinda Nelson   DOB:  13-Sep-1976   MRN:  [id number]   Chief Complaint: Recurrent UTI (Patient said she has no current symptoms of UTI ) Melinda Nelson is a 48 y.o. female who presents today for her Complete Annual Exam. She feels well. She reports exercising some. She reports she is sleeping fairly well. Breast complaints none.  Health Maintenance  Topic Date Due   HIV Screening  Never done   Colon Cancer Screening  Never done   Mammogram  08/20/2022   COVID-19 Vaccine (2 - Janssen risk series) 03/06/2024*   Flu Shot  03/11/2024*   Pap with HPV screening  02/26/2026   DTaP/Tdap/Td vaccine (3 - Td or Tdap) 02/19/2029   Hepatitis C Screening  Completed   HPV Vaccine  Aged Out  *Topic was postponed. The date shown is not the original due date.     Urinary Tract Infection  This is a recurrent problem. The problem occurs intermittently. Pertinent negatives include no chills, frequency or vomiting. She has tried antibiotics (macrobid as needed) for the symptoms.    Review of Systems  Constitutional:  Negative for chills, fatigue and fever.  HENT:  Negative for congestion, hearing loss, tinnitus, trouble swallowing and voice change.   Eyes:  Negative for visual disturbance.  Respiratory:  Negative for cough, chest tightness, shortness of breath and wheezing.   Cardiovascular:  Negative for chest pain, palpitations and leg swelling.  Gastrointestinal:  Negative for abdominal pain, constipation, diarrhea and vomiting.  Endocrine: Negative for polydipsia and polyuria.  Genitourinary:  Positive for menstrual problem (slightly irregular, more cramping at times, varying length). Negative for dysuria, frequency, genital sores, vaginal bleeding and vaginal discharge.  Musculoskeletal:  Negative for arthralgias, gait problem and joint swelling.  Skin:  Negative for color change and rash.  Neurological:  Negative for dizziness, tremors, light-headedness and headaches.   Hematological:  Negative for adenopathy. Does not bruise/bleed easily.  Psychiatric/Behavioral:  Negative for dysphoric mood and sleep disturbance. The patient is not nervous/anxious.      Lab Results  Component Value Date   NA 138 03/02/2022   K 5.0 03/02/2022   CO2 21 03/02/2022   GLUCOSE 90 03/02/2022   BUN 12 03/02/2022   CREATININE 0.98 03/02/2022   CALCIUM 9.3 03/02/2022   EGFR 72 03/02/2022   GFRNONAA 77 02/25/2020   Lab Results  Component Value Date   CHOL 169 03/02/2022   HDL 38 (L) 03/02/2022   LDLCALC 112 (H) 03/02/2022   TRIG 103 03/02/2022   CHOLHDL 4.4 03/02/2022   Lab Results  Component Value Date   TSH 7.910 (H) 03/02/2022   No results found for: "HGBA1C" Lab Results  Component Value Date   WBC 5.7 03/02/2022   HGB 11.2 03/02/2022   HCT 33.5 (L) 03/02/2022   MCV 83 03/02/2022   PLT 254 03/02/2022   Lab Results  Component Value Date   ALT 10 03/02/2022   AST 10 03/02/2022   ALKPHOS 78 03/02/2022   BILITOT 0.3 03/02/2022   Lab Results  Component Value Date   VD25OH 32.4 03/02/2022     Patient Active Problem List   Diagnosis Date Noted   Recurrent bacterial cystitis 02/19/2024   Elevated LDL cholesterol level 04/12/2021   Breast cyst, left 09/07/2018   Vitamin D deficiency 05/20/2016   Vitamin B12 deficiency 05/20/2016   Other headache syndrome 05/20/2016   Neuropathy 05/20/2016   IBS (irritable bowel syndrome) 02/19/2016  Allergies  Allergen Reactions   Lamisil [Terbinafine]     Skin reaction   Tramadol Nausea And Vomiting   Penicillins Rash    Rash    Past Surgical History:  Procedure Laterality Date   CESAREAN SECTION     x 3   TUBAL LIGATION  2009    Social History   Tobacco Use   Smoking status: Never   Smokeless tobacco: Never  Vaping Use   Vaping status: Never Used  Substance Use Topics   Alcohol use: Yes    Alcohol/week: 0.0 standard drinks of alcohol    Comment: rarely   Drug use: No     Medication  list has been reviewed and updated.  Current Meds  Medication Sig   Ascorbic Acid (VITAMIN C) 1000 MG tablet Take 1,000 mg by mouth daily.   cholecalciferol (VITAMIN D) 1000 units tablet Take 1,000 Units by mouth daily.   cyanocobalamin (,VITAMIN B-12,) 1000 MCG/ML injection Inject 1 mL (1,000 mcg total) into the muscle every 14 (fourteen) days. (Patient taking differently: Inject 1,000 mcg into the muscle as needed.)   estradiol (ESTRACE VAGINAL) 0.1 MG/GM vaginal cream Fingertip amount to urethral opening nightly   MAGNESIUM PO Take by mouth daily.   tiZANidine (ZANAFLEX) 4 MG tablet Take 1 tablet (4 mg total) by mouth every 6 (six) hours as needed for muscle spasms.       02/19/2024    4:27 PM 11/11/2022    1:31 PM 03/02/2022    8:03 AM 12/17/2021    3:48 PM  GAD 7 : Generalized Anxiety Score  Nervous, Anxious, on Edge 0 0 0 0  Control/stop worrying 0 0 0 0  Worry too much - different things 0 0 0 0  Trouble relaxing 0 0 0 0  Restless 0 0 0 0  Easily annoyed or irritable 0 0 0 0  Afraid - awful might happen 0 0 0 0  Total GAD 7 Score 0 0 0 0  Anxiety Difficulty Not difficult at all Not difficult at all  Not difficult at all       02/19/2024    4:27 PM 11/11/2022    1:31 PM 03/02/2022    8:03 AM  Depression screen PHQ 2/9  Decreased Interest 0 0 0  Down, Depressed, Hopeless 0 0 0  PHQ - 2 Score 0 0 0  Altered sleeping 0 0 0  Tired, decreased energy 0 0 0  Change in appetite 0 0 0  Feeling bad or failure about yourself  0 0 0  Trouble concentrating 0 0 0  Moving slowly or fidgety/restless 0 0 0  Suicidal thoughts 0 0 0  PHQ-9 Score 0 0 0  Difficult doing work/chores Not difficult at all Not difficult at all     BP Readings from Last 3 Encounters:  02/19/24 122/78  11/11/22 130/86  03/02/22 110/80    Physical Exam Vitals and nursing note reviewed.  Constitutional:      General: She is not in acute distress.    Appearance: Normal appearance. She is well-developed.   HENT:     Head: Normocephalic and atraumatic.     Nose:     Right Sinus: No maxillary sinus tenderness.     Left Sinus: No maxillary sinus tenderness.  Eyes:     General: No scleral icterus.       Right eye: No discharge.        Left eye: No discharge.     Conjunctiva/sclera:  Conjunctivae normal.  Neck:     Thyroid: No thyromegaly.     Vascular: No carotid bruit.  Cardiovascular:     Rate and Rhythm: Normal rate and regular rhythm.     Pulses: Normal pulses.     Heart sounds: Normal heart sounds.  Pulmonary:     Effort: Pulmonary effort is normal. No respiratory distress.     Breath sounds: No wheezing.  Abdominal:     Tenderness: There is abdominal tenderness.  Musculoskeletal:     Cervical back: Normal range of motion. No erythema.     Right lower leg: No edema.     Left lower leg: No edema.  Lymphadenopathy:     Cervical: No cervical adenopathy.  Skin:    General: Skin is warm and dry.     Findings: No rash.  Neurological:     General: No focal deficit present.     Mental Status: She is alert and oriented to person, place, and time.     Cranial Nerves: No cranial nerve deficit.     Sensory: No sensory deficit.     Deep Tendon Reflexes: Reflexes are normal and symmetric.  Psychiatric:        Attention and Perception: Attention normal.        Mood and Affect: Mood normal.     Wt Readings from Last 3 Encounters:  02/19/24 205 lb 4 oz (93.1 kg)  11/11/22 205 lb (93 kg)  03/02/22 199 lb (90.3 kg)    BP 122/78   Pulse (!) 58   Ht 5\' 2"  (1.575 m)   Wt 205 lb 4 oz (93.1 kg)   SpO2 100%   BMI 37.54 kg/m   Assessment and Plan:  Problem List Items Addressed This Visit       Unprioritized   Vitamin D deficiency   She continues on daily supplements      Relevant Orders   VITAMIN D 25 Hydroxy (Vit-D Deficiency, Fractures)   Vitamin B12 deficiency   Will recheck CBC and B12 Continue oral supplement      Relevant Orders   CBC with Differential/Platelet    Vitamin B12   Elevated LDL cholesterol level   Managed with diet and exercise The 10-year ASCVD risk score (Arnett DK, et al., 2019) is: 1.3%   Values used to calculate the score:     Age: 11 years     Sex: Female     Is Non-Hispanic African American: No     Diabetic: No     Tobacco smoker: No     Systolic Blood Pressure: 122 mmHg     Is BP treated: No     HDL Cholesterol: 38 mg/dL     Total Cholesterol: 169 mg/dL       Recurrent bacterial cystitis   Will start estrogen cream to urethra nightly Macrobid after intercourse and bid prn      Relevant Medications   nitrofurantoin, macrocrystal-monohydrate, (MACROBID) 100 MG capsule   estradiol (ESTRACE VAGINAL) 0.1 MG/GM vaginal cream   Other Visit Diagnoses       Annual physical exam    -  Primary   Mammogram next week continue healthy diet and exercise   Relevant Orders   CBC with Differential/Platelet   Comprehensive metabolic panel   Lipid panel   TSH   Vitamin B12   VITAMIN D 25 Hydroxy (Vit-D Deficiency, Fractures)     Colon cancer screening       Cologuard due to average risk  Relevant Orders   Cologuard     Mild hyperlipidemia       Relevant Orders   Lipid panel       No follow-ups on file.    Reubin Milan, MD [facility] Primary Care and Sports Medicine [place]

## 2024-02-19 NOTE — Assessment & Plan Note (Signed)
 She continues on daily supplements

## 2024-02-19 NOTE — Assessment & Plan Note (Signed)
 Will start estrogen cream to urethra nightly Macrobid after intercourse and bid prn

## 2024-02-19 NOTE — Assessment & Plan Note (Addendum)
 Will recheck CBC and B12 Continue IM monthly supplement

## 2024-02-19 NOTE — Assessment & Plan Note (Signed)
 Managed with diet and exercise The 10-year ASCVD risk score (Arnett DK, et al., 2019) is: 1.3%   Values used to calculate the score:     Age: 48 years     Sex: Female     Is Non-Hispanic African American: No     Diabetic: No     Tobacco smoker: No     Systolic Blood Pressure: 122 mmHg     Is BP treated: No     HDL Cholesterol: 38 mg/dL     Total Cholesterol: 169 mg/dL

## 2024-02-26 ENCOUNTER — Ambulatory Visit
Admission: RE | Admit: 2024-02-26 | Discharge: 2024-02-26 | Disposition: A | Discharge status: Home / Self Care | Source: Ambulatory Visit | Attending: Internal Medicine | Admitting: Internal Medicine

## 2024-02-26 DIAGNOSIS — Z1231 Encounter for screening mammogram for malignant neoplasm of breast: Secondary | ICD-10-CM | POA: Diagnosis present

## 2024-02-27 ENCOUNTER — Encounter: Payer: Self-pay | Admitting: Internal Medicine

## 2024-02-27 LAB — VITAMIN D 25 HYDROXY (VIT D DEFICIENCY, FRACTURES): Vit D, 25-Hydroxy: 34.6 ng/mL (ref 30.0–100.0)

## 2024-02-27 LAB — CBC WITH DIFFERENTIAL/PLATELET
Basophils Absolute: 0 10*3/uL (ref 0.0–0.2)
Basos: 1 %
EOS (ABSOLUTE): 0.2 10*3/uL (ref 0.0–0.4)
Eos: 5 %
Hematocrit: 32.4 % — ABNORMAL LOW (ref 34.0–46.6)
Hemoglobin: 10.2 g/dL — ABNORMAL LOW (ref 11.1–15.9)
Immature Grans (Abs): 0 10*3/uL (ref 0.0–0.1)
Immature Granulocytes: 1 %
Lymphocytes Absolute: 2.4 10*3/uL (ref 0.7–3.1)
Lymphs: 50 %
MCH: 25 pg — ABNORMAL LOW (ref 26.6–33.0)
MCHC: 31.5 g/dL (ref 31.5–35.7)
MCV: 79 fL (ref 79–97)
Monocytes Absolute: 0.4 10*3/uL (ref 0.1–0.9)
Monocytes: 8 %
Neutrophils Absolute: 1.7 10*3/uL (ref 1.4–7.0)
Neutrophils: 35 %
Platelets: 287 10*3/uL (ref 150–450)
RBC: 4.08 x10E6/uL (ref 3.77–5.28)
RDW: 15 % (ref 11.7–15.4)
WBC: 4.7 10*3/uL (ref 3.4–10.8)

## 2024-02-27 LAB — COMPREHENSIVE METABOLIC PANEL
ALT: 11 IU/L (ref 0–32)
AST: 14 IU/L (ref 0–40)
Albumin: 4.1 g/dL (ref 3.9–4.9)
Alkaline Phosphatase: 90 IU/L (ref 44–121)
BUN/Creatinine Ratio: 13 (ref 9–23)
BUN: 11 mg/dL (ref 6–24)
Bilirubin Total: <0.2 mg/dL (ref 0.0–1.2)
CO2: 23 mmol/L (ref 20–29)
Calcium: 8.9 mg/dL (ref 8.7–10.2)
Chloride: 103 mmol/L (ref 96–106)
Creatinine, Ser: 0.83 mg/dL (ref 0.57–1.00)
Globulin, Total: 2.9 g/dL (ref 1.5–4.5)
Glucose: 105 mg/dL — ABNORMAL HIGH (ref 70–99)
Potassium: 4.8 mmol/L (ref 3.5–5.2)
Sodium: 136 mmol/L (ref 134–144)
Total Protein: 7 g/dL (ref 6.0–8.5)
eGFR: 86 mL/min/{1.73_m2} (ref >59)

## 2024-02-27 LAB — LIPID PANEL
Chol/HDL Ratio: 5.6 ratio — ABNORMAL HIGH (ref 0.0–4.4)
Cholesterol, Total: 191 mg/dL (ref 100–199)
HDL: 34 mg/dL — ABNORMAL LOW (ref >39)
LDL Chol Calc (NIH): 121 mg/dL — ABNORMAL HIGH (ref 0–99)
Triglycerides: 204 mg/dL — ABNORMAL HIGH (ref 0–149)
VLDL Cholesterol Cal: 36 mg/dL (ref 5–40)

## 2024-02-27 LAB — VITAMIN B12: Vitamin B-12: 263 pg/mL (ref 232–1245)

## 2024-02-27 LAB — TSH: TSH: 4.78 u[IU]/mL — ABNORMAL HIGH (ref 0.450–4.500)

## 2024-03-05 ENCOUNTER — Inpatient Hospital Stay
Admission: RE | Admit: 2024-03-05 | Discharge: 2024-03-05 | Disposition: A | Discharge status: Home / Self Care | Payer: Self-pay | Source: Ambulatory Visit | Attending: Internal Medicine | Admitting: Internal Medicine

## 2024-03-05 ENCOUNTER — Other Ambulatory Visit: Payer: Self-pay | Admitting: *Deleted

## 2024-03-05 DIAGNOSIS — Z1231 Encounter for screening mammogram for malignant neoplasm of breast: Secondary | ICD-10-CM
